# Patient Record
Sex: Female | Born: 1980 | Race: Asian | Hispanic: No | Marital: Married | Smoking: Never smoker
Health system: Southern US, Community
[De-identification: ages and names within clinical notes are randomized; demographics above are authoritative.]

## PROBLEM LIST (undated history)

## (undated) DIAGNOSIS — J45909 Unspecified asthma, uncomplicated: Secondary | ICD-10-CM

## (undated) DIAGNOSIS — F418 Other specified anxiety disorders: Secondary | ICD-10-CM

## (undated) HISTORY — DX: Unspecified asthma, uncomplicated: J45.909

## (undated) HISTORY — DX: Other specified anxiety disorders: F41.8

---

## 2011-04-29 ENCOUNTER — Inpatient Hospital Stay (HOSPITAL_COMMUNITY)
Admission: AD | Admit: 2011-04-29 | Discharge: 2011-04-29 | Disposition: A | Discharge status: Home / Self Care | Payer: 59 | Source: Ambulatory Visit | Attending: Obstetrics and Gynecology | Admitting: Obstetrics and Gynecology

## 2011-04-29 DIAGNOSIS — O479 False labor, unspecified: Secondary | ICD-10-CM | POA: Insufficient documentation

## 2011-05-16 ENCOUNTER — Inpatient Hospital Stay (HOSPITAL_COMMUNITY)
Admission: AD | Admit: 2011-05-16 | Discharge: 2011-05-19 | DRG: 775 | Disposition: A | Discharge status: Home / Self Care | Payer: 59 | Source: Ambulatory Visit | Attending: Obstetrics and Gynecology | Admitting: Obstetrics and Gynecology

## 2011-05-16 LAB — CBC
HCT: 38.3 % (ref 36.0–46.0)
Hemoglobin: 12.9 g/dL (ref 12.0–15.0)
MCH: 28.8 pg (ref 26.0–34.0)
MCHC: 33.7 g/dL (ref 30.0–36.0)
RDW: 14.3 % (ref 11.5–15.5)

## 2011-05-18 LAB — CBC
HCT: 31.9 % — ABNORMAL LOW (ref 36.0–46.0)
MCHC: 32.6 g/dL (ref 30.0–36.0)
MCV: 86.4 fL (ref 78.0–100.0)
RDW: 14.6 % (ref 11.5–15.5)

## 2011-05-24 ENCOUNTER — Inpatient Hospital Stay (HOSPITAL_COMMUNITY): Admission: AD | Admit: 2011-05-24 | Payer: Self-pay | Admitting: Obstetrics and Gynecology

## 2011-06-06 ENCOUNTER — Ambulatory Visit (HOSPITAL_COMMUNITY)
Admission: RE | Admit: 2011-06-06 | Discharge: 2011-06-06 | Disposition: A | Discharge status: Home / Self Care | Payer: 59 | Source: Ambulatory Visit | Attending: Obstetrics and Gynecology | Admitting: Obstetrics and Gynecology

## 2011-06-06 NOTE — Progress Notes (Signed)
Mom comes in for assist with latch, complaining of painful breastfeeding and latching too shallow.  Baby is 62 weeks old, (DOB [birth year]), birth weight 7 lbs. 0 oz. Baby has small diameter mouth and mother stateshe "purses" her mouth when latching which causes pinching pain.   Baby's weight today is 8 lbs. 0.2 oz. Mom uses proper support and positioning on her breast, using cross cradle hold.  Instructed to support baby's head with holding her head from ear to ear, this way she has better control.  Baby latches and slips to base of nipple.  Nipples are pink, but not visible trauma noted on them.  No c/o burning pain.  Teaching done to wait until baby opens wider, and then quickly latch deeply to breast.   Demonstrated chin tug to uncurl a tucked in lower lip.  Mom stated that latch felt more comfortable after lower lip uncurled. Baby fed well on left breast - 40 ml transferred in 10 mins.  Talked about fullness in breast, and to decrease her regular pumping some to help her body regulate milk supply to her baby's need.  Mom latched baby in football hold on right breast.  Repeated chin pulling to uncurl lower lip.  Baby transferred 94 ml from right breast!  Reassured Mom that baby is doing well, and to continue to monitor baby's latch, corrected while still on breast as needed.  To call us prn if pain worsens, or doesn't resolve in a week.

## 2012-03-22 ENCOUNTER — Other Ambulatory Visit (HOSPITAL_COMMUNITY)
Admission: RE | Admit: 2012-03-22 | Discharge: 2012-03-22 | Disposition: A | Discharge status: Home / Self Care | Payer: 59 | Source: Ambulatory Visit | Attending: Family Medicine | Admitting: Family Medicine

## 2012-03-22 DIAGNOSIS — Z1159 Encounter for screening for other viral diseases: Secondary | ICD-10-CM | POA: Insufficient documentation

## 2012-03-22 DIAGNOSIS — Z124 Encounter for screening for malignant neoplasm of cervix: Secondary | ICD-10-CM | POA: Insufficient documentation

## 2015-01-16 LAB — OB RESULTS CONSOLE RPR: RPR: NONREACTIVE

## 2015-01-16 LAB — OB RESULTS CONSOLE RUBELLA ANTIBODY, IGM: RUBELLA: IMMUNE

## 2015-01-16 LAB — OB RESULTS CONSOLE HIV ANTIBODY (ROUTINE TESTING): HIV: NONREACTIVE

## 2015-01-16 LAB — OB RESULTS CONSOLE HEPATITIS B SURFACE ANTIGEN: HEP B S AG: NEGATIVE

## 2015-01-19 LAB — OB RESULTS CONSOLE GC/CHLAMYDIA
Chlamydia: NEGATIVE
Gonorrhea: NEGATIVE

## 2015-07-24 LAB — OB RESULTS CONSOLE GBS: GBS: POSITIVE

## 2015-08-08 ENCOUNTER — Inpatient Hospital Stay (HOSPITAL_COMMUNITY)
Admission: AD | Admit: 2015-08-08 | Discharge: 2015-08-10 | DRG: 775 | Disposition: A | Discharge status: Home / Self Care | Payer: BLUE CROSS/BLUE SHIELD | Source: Ambulatory Visit | Attending: Obstetrics and Gynecology | Admitting: Obstetrics and Gynecology

## 2015-08-08 ENCOUNTER — Inpatient Hospital Stay (HOSPITAL_COMMUNITY): Payer: BLUE CROSS/BLUE SHIELD | Admitting: Anesthesiology

## 2015-08-08 ENCOUNTER — Encounter (HOSPITAL_COMMUNITY): Payer: Self-pay | Admitting: *Deleted

## 2015-08-08 DIAGNOSIS — Z3A00 Weeks of gestation of pregnancy not specified: Secondary | ICD-10-CM | POA: Diagnosis not present

## 2015-08-08 DIAGNOSIS — O99824 Streptococcus B carrier state complicating childbirth: Secondary | ICD-10-CM | POA: Diagnosis present

## 2015-08-08 LAB — CBC
HCT: 37.4 % (ref 36.0–46.0)
Hemoglobin: 12.7 g/dL (ref 12.0–15.0)
MCH: 28.5 pg (ref 26.0–34.0)
MCHC: 34 g/dL (ref 30.0–36.0)
MCV: 84 fL (ref 78.0–100.0)
PLATELETS: 167 10*3/uL (ref 150–400)
RBC: 4.45 MIL/uL (ref 3.87–5.11)
RDW: 14.8 % (ref 11.5–15.5)
WBC: 12.3 10*3/uL — ABNORMAL HIGH (ref 4.0–10.5)

## 2015-08-08 LAB — TYPE AND SCREEN
ABO/RH(D): B POS
Antibody Screen: NEGATIVE

## 2015-08-08 LAB — RPR: RPR: NONREACTIVE

## 2015-08-08 LAB — ABO/RH: ABO/RH(D): B POS

## 2015-08-08 MED ORDER — TETANUS-DIPHTH-ACELL PERTUSSIS 5-2.5-18.5 LF-MCG/0.5 IM SUSP: 0.5000 mL | Freq: Once | INTRAMUSCULAR | Status: DC

## 2015-08-08 MED ORDER — OXYCODONE-ACETAMINOPHEN 5-325 MG PO TABS: 1.0000 | ORAL_TABLET | ORAL | Status: DC | PRN

## 2015-08-08 MED ORDER — PENICILLIN G POTASSIUM 5000000 UNITS IJ SOLR
5.0000 10*6.[IU] | Freq: Once | INTRAVENOUS | Status: AC
  Administered 2015-08-08: 5 10*6.[IU] via INTRAVENOUS
  Filled 2015-08-08: qty 5

## 2015-08-08 MED ORDER — LACTATED RINGERS IV SOLN
INTRAVENOUS | Status: DC
  Administered 2015-08-08 (×3): via INTRAVENOUS

## 2015-08-08 MED ORDER — MEDROXYPROGESTERONE ACETATE 150 MG/ML IM SUSP: 150.0000 mg | INTRAMUSCULAR | Status: DC | PRN

## 2015-08-08 MED ORDER — ACETAMINOPHEN 325 MG PO TABS: 650.0000 mg | ORAL_TABLET | ORAL | Status: DC | PRN

## 2015-08-08 MED ORDER — LIDOCAINE HCL (PF) 1 % IJ SOLN
30.0000 mL | INTRAMUSCULAR | Status: DC | PRN
  Filled 2015-08-08: qty 30

## 2015-08-08 MED ORDER — LACTATED RINGERS IV SOLN
500.0000 mL | INTRAVENOUS | Status: DC | PRN
  Administered 2015-08-08: 300 mL via INTRAVENOUS
  Administered 2015-08-08: 500 mL via INTRAVENOUS

## 2015-08-08 MED ORDER — OXYCODONE-ACETAMINOPHEN 5-325 MG PO TABS
1.0000 | ORAL_TABLET | ORAL | Status: DC | PRN
Start: 2015-08-08 — End: 2015-08-10

## 2015-08-08 MED ORDER — ZOLPIDEM TARTRATE 5 MG PO TABS: 5.0000 mg | ORAL_TABLET | Freq: Every evening | ORAL | Status: DC | PRN

## 2015-08-08 MED ORDER — SIMETHICONE 80 MG PO CHEW
80.0000 mg | CHEWABLE_TABLET | ORAL | Status: DC | PRN
Start: 2015-08-08 — End: 2015-08-10

## 2015-08-08 MED ORDER — ONDANSETRON HCL 4 MG PO TABS: 4.0000 mg | ORAL_TABLET | ORAL | Status: DC | PRN

## 2015-08-08 MED ORDER — ONDANSETRON HCL 4 MG/2ML IJ SOLN: 4.0000 mg | Freq: Four times a day (QID) | INTRAMUSCULAR | Status: DC | PRN

## 2015-08-08 MED ORDER — BENZOCAINE-MENTHOL 20-0.5 % EX AERO
1.0000 "application " | INHALATION_SPRAY | CUTANEOUS | Status: DC | PRN
  Administered 2015-08-08: 1 via TOPICAL
  Filled 2015-08-08: qty 56

## 2015-08-08 MED ORDER — WITCH HAZEL-GLYCERIN EX PADS
1.0000 "application " | MEDICATED_PAD | CUTANEOUS | Status: DC | PRN
  Administered 2015-08-08: 1 via TOPICAL

## 2015-08-08 MED ORDER — DIPHENHYDRAMINE HCL 25 MG PO CAPS: 25.0000 mg | ORAL_CAPSULE | Freq: Four times a day (QID) | ORAL | Status: DC | PRN

## 2015-08-08 MED ORDER — FENTANYL 2.5 MCG/ML BUPIVACAINE 1/10 % EPIDURAL INFUSION (WH - ANES)
14.0000 mL/h | INTRAMUSCULAR | Status: DC | PRN
  Administered 2015-08-08 (×2): 14 mL/h via EPIDURAL
  Filled 2015-08-08: qty 125

## 2015-08-08 MED ORDER — DIBUCAINE 1 % RE OINT: 1.0000 "application " | TOPICAL_OINTMENT | RECTAL | Status: DC | PRN

## 2015-08-08 MED ORDER — SENNOSIDES-DOCUSATE SODIUM 8.6-50 MG PO TABS
2.0000 | ORAL_TABLET | ORAL | Status: DC
  Administered 2015-08-08 – 2015-08-10 (×2): 2 via ORAL
  Filled 2015-08-08 (×2): qty 2

## 2015-08-08 MED ORDER — PENICILLIN G POTASSIUM 5000000 UNITS IJ SOLR
2.5000 10*6.[IU] | INTRAMUSCULAR | Status: DC
  Administered 2015-08-08 (×3): 2.5 10*6.[IU] via INTRAVENOUS
  Filled 2015-08-08 (×6): qty 2.5

## 2015-08-08 MED ORDER — PHENYLEPHRINE 40 MCG/ML (10ML) SYRINGE FOR IV PUSH (FOR BLOOD PRESSURE SUPPORT)
80.0000 ug | PREFILLED_SYRINGE | INTRAVENOUS | Status: DC | PRN
  Filled 2015-08-08: qty 2
  Filled 2015-08-08: qty 20

## 2015-08-08 MED ORDER — PRENATAL MULTIVITAMIN CH
1.0000 | ORAL_TABLET | Freq: Every day | ORAL | Status: DC
  Administered 2015-08-09: 1 via ORAL
  Filled 2015-08-08: qty 1

## 2015-08-08 MED ORDER — CITRIC ACID-SODIUM CITRATE 334-500 MG/5ML PO SOLN: 30.0000 mL | ORAL | Status: DC | PRN

## 2015-08-08 MED ORDER — OXYCODONE-ACETAMINOPHEN 5-325 MG PO TABS: 2.0000 | ORAL_TABLET | ORAL | Status: DC | PRN

## 2015-08-08 MED ORDER — EPHEDRINE 5 MG/ML INJ
10.0000 mg | INTRAVENOUS | Status: DC | PRN
  Filled 2015-08-08: qty 2

## 2015-08-08 MED ORDER — DIPHENHYDRAMINE HCL 50 MG/ML IJ SOLN: 12.5000 mg | INTRAMUSCULAR | Status: DC | PRN

## 2015-08-08 MED ORDER — IBUPROFEN 600 MG PO TABS
600.0000 mg | ORAL_TABLET | Freq: Four times a day (QID) | ORAL | Status: DC
  Administered 2015-08-08 – 2015-08-10 (×6): 600 mg via ORAL
  Filled 2015-08-08 (×6): qty 1

## 2015-08-08 MED ORDER — ONDANSETRON HCL 4 MG/2ML IJ SOLN: 4.0000 mg | INTRAMUSCULAR | Status: DC | PRN

## 2015-08-08 MED ORDER — LIDOCAINE HCL (PF) 1 % IJ SOLN
INTRAMUSCULAR | Status: DC | PRN
  Administered 2015-08-08 (×2): 8 mL via EPIDURAL

## 2015-08-08 MED ORDER — MEASLES, MUMPS & RUBELLA VAC ~~LOC~~ INJ: 0.5000 mL | INJECTION | Freq: Once | SUBCUTANEOUS | Status: DC

## 2015-08-08 MED ORDER — OXYTOCIN 40 UNITS IN LACTATED RINGERS INFUSION - SIMPLE MED
62.5000 mL/h | INTRAVENOUS | Status: DC
  Filled 2015-08-08: qty 1000

## 2015-08-08 MED ORDER — OXYTOCIN BOLUS FROM INFUSION
500.0000 mL | INTRAVENOUS | Status: DC
  Administered 2015-08-08: 500 mL via INTRAVENOUS

## 2015-08-08 MED ORDER — LANOLIN HYDROUS EX OINT: TOPICAL_OINTMENT | CUTANEOUS | Status: DC | PRN

## 2015-08-08 NOTE — Progress Notes (Signed)
Pt informed that Dr Renaldo Fiddler recommendation at this time is to start pitocin since arom and no cervical change.  Pt refuses pitocin at this time

## 2015-08-08 NOTE — Progress Notes (Signed)
Dr Arelia Sneddon notified of pt's arrival and complaints, ROM, orders received to admit pt

## 2015-08-08 NOTE — MAU Note (Signed)
Pt reports ROM at 0045, some contractions. G3P1

## 2015-08-08 NOTE — Anesthesia Preprocedure Evaluation (Addendum)
Anesthesia Evaluation  Patient identified by MRN, date of birth, ID band Patient awake    Reviewed: Allergy & Precautions, H&P , NPO status , Patient's Chart, lab work & pertinent test results  Airway Mallampati: II  TM Distance: >3 FB Neck ROM: full    Dental no notable dental hx.    Pulmonary neg pulmonary ROS,    Pulmonary exam normal       Cardiovascular negative cardio ROS Normal cardiovascular exam    Neuro/Psych negative neurological ROS  negative psych ROS   GI/Hepatic negative GI ROS, Neg liver ROS,   Endo/Other  negative endocrine ROS  Renal/GU negative Renal ROS     Musculoskeletal   Abdominal Normal abdominal exam  (+)   Peds  Hematology negative hematology ROS (+)   Anesthesia Other Findings   Reproductive/Obstetrics (+) Pregnancy                             Anesthesia Physical Anesthesia Plan  ASA: II  Anesthesia Plan: Epidural   Post-op Pain Management:    Induction:   Airway Management Planned:   Additional Equipment:   Intra-op Plan:   Post-operative Plan:   Informed Consent: I have reviewed the patients History and Physical, chart, labs and discussed the procedure including the risks, benefits and alternatives for the proposed anesthesia with the patient or authorized representative who has indicated his/her understanding and acceptance.     Plan Discussed with:   Anesthesia Plan Comments:         Anesthesia Quick Evaluation  

## 2015-08-08 NOTE — H&P (Signed)
Brandi Snyder is a 34 y.o. female presenting for SROM.  Notes irregular ctx.  Pregnancy uncomplicated.  History OB History    Gravida Para Term Preterm AB TAB SAB Ectopic Multiple Living   History reviewed. No pertinent past medical history. History reviewed. No pertinent past surgical history. Family History: family history is not on file. Social History:  reports that she has never smoked. She has never used smokeless tobacco. She reports that she does not drink alcohol or use illicit drugs.   Prenatal Transfer Tool  Maternal Diabetes: No Genetic Screening: Normal Maternal Ultrasounds/Referrals: Normal Fetal Ultrasounds or other Referrals:  None Maternal Substance Abuse:  No Significant Maternal Medications:  None Significant Maternal Lab Results:  None Other Comments:  None  ROS  Dilation: 5 Effacement (%): 90 Station: -2 Exam by:: Tomie China, RN Blood pressure 123/76, pulse 97, temperature 98.3 F (36.8 C), temperature source Oral, resp. rate 18, height  (1.575 m), weight 190 lb (86.183 kg), SpO2 98 %. Exam Physical Exam  gen - NAD Abd - gravid, NT Ext - NT, no edema Cvx 5/90/-2  Prenatal labs: ABO, Rh: --/--/B POS, B POS (09/21 0340) Antibody: NEG (09/21 0340) Rubella: Immune (03/01 0000) RPR: Nonreactive (03/01 0000)  HBsAg: Negative (03/01 0000)  HIV: Non-reactive (03/01 0000)  GBS: Positive (09/06 0000)   Assessment/Plan: Admit Exp mngt - augment w/ pitocin Epidural prn   ADKINS,GRETCHEN 08/08/2015, 7:33 AM

## 2015-08-08 NOTE — Anesthesia Procedure Notes (Signed)
Epidural Patient location during procedure: OB Start time: 08/08/2015 12:40 PM End time: 08/08/2015 12:48 PM  Staffing Anesthesiologist: Leilani Able Performed by: anesthesiologist   Preanesthetic Checklist Completed: patient identified, surgical consent, pre-op evaluation, timeout performed, IV checked, risks and benefits discussed and monitors and equipment checked  Epidural Patient position: sitting Prep: site prepped and draped and DuraPrep Patient monitoring: continuous pulse ox and blood pressure Approach: midline Location: L3-L4 Injection technique: LOR air  Needle:  Needle type: Tuohy  Needle gauge: 17 G Needle length: 9 cm and 9 Needle insertion depth: 5 cm cm Catheter type: closed end flexible Catheter size: 19 Gauge Catheter at skin depth: 10 cm Test dose: negative and Other  Assessment Sensory level: T9 Events: blood not aspirated, injection not painful, no injection resistance, negative IV test and no paresthesia  Additional Notes Reason for block:procedure for pain

## 2015-08-08 NOTE — Progress Notes (Signed)
svd of vigorous female infant, apgars 9,9 Placenta delivered spontaneous, intact w/ 3VC 2nd degree laceration repaired w/ 3-0 vicryl rapide Fundus firm.  EBL 300cc Mom and baby stable couplet care

## 2015-08-09 LAB — CBC
HCT: 33.6 % — ABNORMAL LOW (ref 36.0–46.0)
HEMOGLOBIN: 10.9 g/dL — AB (ref 12.0–15.0)
MCH: 27.4 pg (ref 26.0–34.0)
MCHC: 32.4 g/dL (ref 30.0–36.0)
MCV: 84.4 fL (ref 78.0–100.0)
PLATELETS: 142 10*3/uL — AB (ref 150–400)
RBC: 3.98 MIL/uL (ref 3.87–5.11)
RDW: 14.9 % (ref 11.5–15.5)
WBC: 13.2 10*3/uL — AB (ref 4.0–10.5)

## 2015-08-09 NOTE — Anesthesia Postprocedure Evaluation (Signed)
Anesthesia Post Note  Patient: Brandi Snyder  Procedure(s) Performed: * No procedures listed *  Anesthesia type: Epidural  Patient location: Mother/Baby  Post pain: Pain level controlled  Post assessment: Post-op Vital signs reviewed  Last Vitals:  Filed Vitals:   08/09/15 0640  BP: 109/49  Pulse: 82  Temp: 36.5 C  Resp: 17    Post vital signs: Reviewed  Level of consciousness:alert  Complications: No apparent anesthesia complications

## 2015-08-09 NOTE — Lactation Note (Signed)
This note was copied from the chart of Brandi Snyder. Lactation Consultation Note; Experienced BF mom reports baby has been nursing pretty well but her nipples are getting tender- nipples pink but look intact. Assisted mom with latch in cross cradle hold- encouraged to wait for wide open mouth and to keep the baby close to the breast throughout the feeding. Mom reports that feels much better. Still nursing when I left room. Mom requested manual pump and comfort gels. - given with instructions for use and cleaning. Discussed cluster feeding and encouraged to feed whenever she sees feeding cues, No further questions at present. To call prn  Patient Name: Brandi Wells Gerdeman [id number] Date: 08/09/2015 Reason for consult: Initial assessment   Maternal Data Formula Feeding for Exclusion: No Has patient been taught Hand Expression?: Yes Does the patient have breastfeeding experience prior to this delivery?: Yes  Feeding Feeding Type: Breast Fed Length of feed: 25 min  LATCH Score/Interventions Latch: Grasps breast easily, tongue down, lips flanged, rhythmical sucking. Intervention(s): Breast massage  Audible Swallowing: A few with stimulation Intervention(s): Skin to skin Intervention(s): Hand expression  Type of Nipple: Everted at rest and after stimulation  Comfort (Breast/Nipple): Filling, red/small blisters or bruises, mild/mod discomfort  Problem noted: Mild/Moderate discomfort Interventions (Mild/moderate discomfort): Comfort gels;Hand expression  Hold (Positioning): Assistance needed to correctly position infant at breast and maintain latch. Intervention(s): Breastfeeding basics reviewed  LATCH Score: 7  Lactation Tools Discussed/Used WIC Program: No Pump Review: Setup, frequency, and cleaning Initiated by:: dw Date initiated:: 08/09/15   Consult Status Consult Status: Follow-up Date: 08/10/15 Follow-up type: In-patient    Pamelia Hoit 08/09/2015, 1:38  PM

## 2015-08-09 NOTE — Progress Notes (Signed)
S:  Patient is doing well.  O:  BP 109/49 mmHg  Pulse 82  Temp(Src) 97.7 F (36.5 C) (Oral)  Resp 17  Ht  (1.575 m)  Wt 86.183 kg (190 lb)  BMI 34.74 kg/m2  SpO2 98%  Breastfeeding? Unknown Abdomen is soft and non tender  Results for orders placed or performed during the hospital encounter of 08/08/15 (from the past 24 hour(s))  CBC     Status: Abnormal   Collection Time: 08/09/15  6:36 AM  Result Value Ref Range   WBC 13.2 (H) 4.0 - 10.5 K/uL   RBC 3.98 3.87 - 5.11 MIL/uL   Hemoglobin 10.9 (L) 12.0 - 15.0 g/dL   HCT 16.1 (L) 09.6 - 04.5 %   MCV 84.4 78.0 - 100.0 fL   MCH 27.4 26.0 - 34.0 pg   MCHC 32.4 30.0 - 36.0 g/dL   RDW 40.9 81.1 - 91.4 %   Platelets 142 (L) 150 - 400 K/uL   IMPRESSION: PPD #1 Doing well Routine care Patient desires circ to be performed in peds office

## 2015-08-10 MED ORDER — IBUPROFEN 600 MG PO TABS: 600.0000 mg | ORAL_TABLET | Freq: Four times a day (QID) | ORAL | Status: AC | PRN

## 2015-08-10 NOTE — Progress Notes (Signed)
Post Partum Day 2 Subjective: no complaints, up ad lib, voiding, tolerating PO and + flatus  Objective: Blood pressure 115/41, pulse 66, temperature 98.4 F (36.9 C), temperature source Oral, resp. rate 18, height  (1.575 m), weight 190 lb (86.183 kg), SpO2 98 %, unknown if currently breastfeeding.  Physical Exam:  General: alert, cooperative and no distress Lochia: appropriate Uterine Fundus: firm Incision: healing well DVT Evaluation: No evidence of DVT seen on physical exam.   Recent Labs  08/08/15 0340 08/09/15 0636  HGB 12.7 10.9*  HCT 37.4 33.6*    Assessment/Plan: Discharge home   LOS: 2 days   TOMBLIN II,JAMES E 08/10/2015, 8:39 AM

## 2015-08-10 NOTE — Discharge Summary (Signed)
Obstetric Discharge Summary Reason for Admission: onset of labor Prenatal Procedures: none Intrapartum Procedures: spontaneous vaginal delivery Postpartum Procedures: none Complications-Operative and Postpartum: none HEMOGLOBIN  Date Value Ref Range Status  08/09/2015 10.9* 12.0 - 15.0 g/dL Final   HCT  Date Value Ref Range Status  08/09/2015 33.6* 36.0 - 46.0 % Final    Physical Exam:  General: alert, cooperative and no distress Lochia: appropriate Uterine Fundus: firm Incision: healing well DVT Evaluation: No evidence of DVT seen on physical exam.  Discharge Diagnoses: Term Pregnancy-delivered  Discharge Information: Date: 08/10/2015 Activity: pelvic rest Diet: routine Medications: PNV and Ibuprofen Condition: stable Instructions: refer to practice specific booklet Discharge to: home   Newborn Data: Live born female  Birth Weight: 7 lb 9.7 oz (3450 g) APGAR: 9, 9  Home with mother.  TOMBLIN II,JAMES E 08/10/2015, 2:07 PM

## 2015-08-22 ENCOUNTER — Ambulatory Visit (HOSPITAL_COMMUNITY)
Admission: RE | Admit: 2015-08-22 | Discharge: 2015-08-22 | Disposition: A | Discharge status: Home / Self Care | Payer: BLUE CROSS/BLUE SHIELD | Source: Ambulatory Visit | Attending: Obstetrics and Gynecology | Admitting: Obstetrics and Gynecology

## 2015-08-22 NOTE — Lactation Note (Signed)
Lactation Consult  Mother's reason for visit: shallow latch per mom  Visit Type:  Feeding assessment  Appointment Notes: Difficult latch  Consult:  Initial Lactation Consultant:  Kathrin Greathouse  ________________________________________________________________________ Joan Flores Name: Jenita Seashore Lui Date of Birth: 08/08/2016 Pediatrician: Dr. Suzanna Obey  Gender: female Gestational Age: [redacted]w[redacted]d (At Birth) Birth Weight: 7 lb 9.7 oz (3450 g) Weight at Discharge: Weight: 7 lb 2.3 oz (3240 g)Date of Discharge: 08/10/2015 Filed Weights   08/08/15 1721 08/08/15 2337 08/09/15 2338  Weight: 7 lb 9.7 oz (3450 g) 7 lb 8.8 oz (3425 g) 7 lb 2.3 oz (3240 g)   Last weight taken from location outside of [facility]:7-9 oz 10 /3 - per mom Dr. Isidore Moos   Today's weight - 3474 g , 7-10.6 oz    ________________________________________________________________________  Mother's Name: Hassell Done Type of delivery:  Vaginal Delivery  Breastfeeding Experience:  1st baby breast fed 14 months , this is moms 2nd baby Maternal Medical Conditions:  No risk  Maternal Medications: PNV , Stool Softener - Colace   ________________________________________________________________________  Breastfeeding History (Post Discharge)  Frequency of breastfeeding:  Per mom every 2-3 hours  Duration of feeding:  20 -40 mins   Supplementing: none per mom   Pumping - Hand pump , and DEBP Medela 1-2 times / day , 2.5 oz   Infant Intake and Output Assessment  Voids:  5  in 24 hrs.  Color:  Clear yellow Stools:  3-5  in 24 hrs.  Color:  Yellow  ________________________________________________________________________  Maternal Breast Assessment  Breast:  Full Nipple:  Erect Pain level:  2 Pain interventions:  Expressed breast milk  _______________________________________________________________________ Feeding Assessment/Evaluation- color pink, baby presents being  fussy and hungry , settled down once he was fed and satisfied   Initial feeding assessment:  Infant's oral assessment:  Variance - short labial frenulum slightly above gum line , upper lip with exam at latch  Some mobility of tongue , LC suspects  Short posterior frenulum , high palate, unable to raise tongue above the corners of mouth  And noted to cup is tongue   Positioning:  Football Left breast  LATCH documentation:  Latch:  2 = Grasps breast easily, tongue down, lips flanged, rhythmical sucking.  Audible swallowing:  2 = Spontaneous and intermittent  Type of nipple:  2 = Everted at rest and after stimulation  Comfort (Breast/Nipple):  1 = Filling, red/small blisters or bruises, mild/mod discomfort  Hold (Positioning):  1 = Assistance needed to correctly position infant at breast and maintain latch  LATCH score:  8   Attached assessment:  Deep  Lips flanged:  Yes.    Lips untucked:  No.  Suck assessment:  Nutritive  Tools:  None  Instructed on use and cleaning of tool:  No.  Pre-feed weight:  3474 g , 7-10.6 oz  Post-feed weight:  3522g , 7-12.2 oz  Amount transferred:  48 ml  Amount supplemented:  None   Additional Feeding Assessment -   Infant's oral assessment:  Variance - see above note   Positioning:  Cross cradle Right breast  LATCH documentation:  Latch:  2 = Grasps breast easily, tongue down, lips flanged, rhythmical sucking.  Audible swallowing:  2 = Spontaneous and intermittent  Type of nipple:  2 = Everted at rest and after stimulation  Comfort (Breast/Nipple):  1 = Filling, red/small blisters or bruises, mild/mod discomfort  Hold (Positioning):  1 = Assistance needed to correctly position infant at breast  and maintain latch  LATCH score:  8   Attached assessment:  Deep  Lips flanged:  No.  Lips untucked:  Yes.    Suck assessment:  Nutritive  Tools:  Comfort gels Instructed on use and cleaning of tool:  Yes.    Pre-feed weight:  3522  g ,  7-12.2 oz  Post-feed weight:  3534 g , 7-12.7 oz  Amount transferred:  12 ml  Amount supplemented:  None   Additional Feeding; Pre- weight - 3534 g , 7-12.7 oz  Post - weight- 3546g , 7-13.1  Amount transferred: 12 ml  Amount supplemented: none   Amount collected from leakage = 16 ml    Total amount pumped post feed: none needed   Total amount transferred:  72 ml  Plus 16 ml leakage off the opposite breast when mom was feeding  Total supplement given:  None needed   Lactation Impression:  Mom's initial reason for LC visit was Difficult latch  With assessment she mentioned sore nipples  Nipples both appeared healthy , with latch per mom feeling intermittent pinching  Breast compressions helped and when  Moms breast weren't has full. LC suspects a frenulum issue - see above note for details  Baby did well with the volume of milk transferred off the breast for age - 54 weeks old - 72 ml  Baby is back up to Birth weight as of 10/3 at the Dr. Isidore Moos and 7-10.6 oz today. Mom's milk supply is at a good level for age of baby  See [facility] Plan for details below    Lactation Plan: Praised mom for her efforts breast feeding  Per mom F/U with Dr. Suzanna Obey 10/21 1 month apt. Lc recommended to mom to consider attending the [facility] Monday Evening Support Group at 7 pm or Tuesday 11am. Sore Nipple Prevention and tx  EBM to nipples liberally, cold comfort gels for 6 days after feeding  If to full @ start of feeding - hand express or use hand pump to release fullness 10 ml  If Vincenza Hews only feeds 1st breast , release 2nd breast down if needed , Keep it simple - hand express - or any pump  Depth at the breast with latch importance for prevention of soreness If intermittent discomfort pinching continues with latch even with above recommendations - see Frenulum web sites Transitioning to Bottle for flexibility and returning back to work  At 4-6 weeks PP, after am feeding post pump , mid day ,  afternoon, 10 -15 mins, save milk  1 bottle a day at that age 10-4 oz.  Call as needed with Breast feeding Questions or concerns

## 2016-02-26 DIAGNOSIS — M9901 Segmental and somatic dysfunction of cervical region: Secondary | ICD-10-CM | POA: Diagnosis not present

## 2016-02-26 DIAGNOSIS — M542 Cervicalgia: Secondary | ICD-10-CM | POA: Diagnosis not present

## 2016-02-27 DIAGNOSIS — M9901 Segmental and somatic dysfunction of cervical region: Secondary | ICD-10-CM | POA: Diagnosis not present

## 2016-02-27 DIAGNOSIS — M542 Cervicalgia: Secondary | ICD-10-CM | POA: Diagnosis not present

## 2016-02-28 DIAGNOSIS — M542 Cervicalgia: Secondary | ICD-10-CM | POA: Diagnosis not present

## 2016-02-28 DIAGNOSIS — M9901 Segmental and somatic dysfunction of cervical region: Secondary | ICD-10-CM | POA: Diagnosis not present

## 2016-03-03 DIAGNOSIS — M9901 Segmental and somatic dysfunction of cervical region: Secondary | ICD-10-CM | POA: Diagnosis not present

## 2016-03-03 DIAGNOSIS — M542 Cervicalgia: Secondary | ICD-10-CM | POA: Diagnosis not present

## 2016-03-05 DIAGNOSIS — Z30430 Encounter for insertion of intrauterine contraceptive device: Secondary | ICD-10-CM | POA: Diagnosis not present

## 2016-03-05 DIAGNOSIS — M9901 Segmental and somatic dysfunction of cervical region: Secondary | ICD-10-CM | POA: Diagnosis not present

## 2016-03-05 DIAGNOSIS — Z3200 Encounter for pregnancy test, result unknown: Secondary | ICD-10-CM | POA: Diagnosis not present

## 2016-03-05 DIAGNOSIS — M542 Cervicalgia: Secondary | ICD-10-CM | POA: Diagnosis not present

## 2016-03-10 DIAGNOSIS — M9901 Segmental and somatic dysfunction of cervical region: Secondary | ICD-10-CM | POA: Diagnosis not present

## 2016-03-10 DIAGNOSIS — M542 Cervicalgia: Secondary | ICD-10-CM | POA: Diagnosis not present

## 2016-03-13 DIAGNOSIS — M542 Cervicalgia: Secondary | ICD-10-CM | POA: Diagnosis not present

## 2016-03-13 DIAGNOSIS — M9901 Segmental and somatic dysfunction of cervical region: Secondary | ICD-10-CM | POA: Diagnosis not present

## 2016-03-17 DIAGNOSIS — M542 Cervicalgia: Secondary | ICD-10-CM | POA: Diagnosis not present

## 2016-03-17 DIAGNOSIS — M9901 Segmental and somatic dysfunction of cervical region: Secondary | ICD-10-CM | POA: Diagnosis not present

## 2016-03-24 DIAGNOSIS — M542 Cervicalgia: Secondary | ICD-10-CM | POA: Diagnosis not present

## 2016-03-24 DIAGNOSIS — M9901 Segmental and somatic dysfunction of cervical region: Secondary | ICD-10-CM | POA: Diagnosis not present

## 2016-03-31 DIAGNOSIS — M9901 Segmental and somatic dysfunction of cervical region: Secondary | ICD-10-CM | POA: Diagnosis not present

## 2016-03-31 DIAGNOSIS — M542 Cervicalgia: Secondary | ICD-10-CM | POA: Diagnosis not present

## 2016-04-07 DIAGNOSIS — M9901 Segmental and somatic dysfunction of cervical region: Secondary | ICD-10-CM | POA: Diagnosis not present

## 2016-04-07 DIAGNOSIS — M542 Cervicalgia: Secondary | ICD-10-CM | POA: Diagnosis not present

## 2016-04-23 DIAGNOSIS — M9901 Segmental and somatic dysfunction of cervical region: Secondary | ICD-10-CM | POA: Diagnosis not present

## 2016-04-23 DIAGNOSIS — M542 Cervicalgia: Secondary | ICD-10-CM | POA: Diagnosis not present

## 2016-05-14 DIAGNOSIS — M9901 Segmental and somatic dysfunction of cervical region: Secondary | ICD-10-CM | POA: Diagnosis not present

## 2016-05-14 DIAGNOSIS — M542 Cervicalgia: Secondary | ICD-10-CM | POA: Diagnosis not present

## 2016-05-26 DIAGNOSIS — M9901 Segmental and somatic dysfunction of cervical region: Secondary | ICD-10-CM | POA: Diagnosis not present

## 2016-05-26 DIAGNOSIS — M542 Cervicalgia: Secondary | ICD-10-CM | POA: Diagnosis not present

## 2016-09-03 DIAGNOSIS — Z13220 Encounter for screening for lipoid disorders: Secondary | ICD-10-CM | POA: Diagnosis not present

## 2016-09-03 DIAGNOSIS — Z131 Encounter for screening for diabetes mellitus: Secondary | ICD-10-CM | POA: Diagnosis not present

## 2016-09-03 DIAGNOSIS — Z0000 Encounter for general adult medical examination without abnormal findings: Secondary | ICD-10-CM | POA: Diagnosis not present

## 2016-09-03 DIAGNOSIS — Z23 Encounter for immunization: Secondary | ICD-10-CM | POA: Diagnosis not present

## 2016-11-05 DIAGNOSIS — Z01419 Encounter for gynecological examination (general) (routine) without abnormal findings: Secondary | ICD-10-CM | POA: Diagnosis not present

## 2016-11-05 DIAGNOSIS — Z6827 Body mass index (BMI) 27.0-27.9, adult: Secondary | ICD-10-CM | POA: Diagnosis not present

## 2016-12-15 DIAGNOSIS — A084 Viral intestinal infection, unspecified: Secondary | ICD-10-CM | POA: Diagnosis not present

## 2017-08-13 DIAGNOSIS — L02421 Furuncle of right axilla: Secondary | ICD-10-CM | POA: Diagnosis not present

## 2017-08-13 DIAGNOSIS — B9562 Methicillin resistant Staphylococcus aureus infection as the cause of diseases classified elsewhere: Secondary | ICD-10-CM | POA: Diagnosis not present

## 2017-09-16 DIAGNOSIS — Z136 Encounter for screening for cardiovascular disorders: Secondary | ICD-10-CM | POA: Diagnosis not present

## 2017-09-16 DIAGNOSIS — N898 Other specified noninflammatory disorders of vagina: Secondary | ICD-10-CM | POA: Diagnosis not present

## 2017-09-16 DIAGNOSIS — Z23 Encounter for immunization: Secondary | ICD-10-CM | POA: Diagnosis not present

## 2017-09-16 DIAGNOSIS — Z0000 Encounter for general adult medical examination without abnormal findings: Secondary | ICD-10-CM | POA: Diagnosis not present

## 2017-09-16 DIAGNOSIS — Z131 Encounter for screening for diabetes mellitus: Secondary | ICD-10-CM | POA: Diagnosis not present

## 2017-12-02 DIAGNOSIS — Z6827 Body mass index (BMI) 27.0-27.9, adult: Secondary | ICD-10-CM | POA: Diagnosis not present

## 2017-12-02 DIAGNOSIS — Z01419 Encounter for gynecological examination (general) (routine) without abnormal findings: Secondary | ICD-10-CM | POA: Diagnosis not present

## 2018-09-14 DIAGNOSIS — M9907 Segmental and somatic dysfunction of upper extremity: Secondary | ICD-10-CM | POA: Diagnosis not present

## 2018-09-14 DIAGNOSIS — M9902 Segmental and somatic dysfunction of thoracic region: Secondary | ICD-10-CM | POA: Diagnosis not present

## 2018-09-14 DIAGNOSIS — M9901 Segmental and somatic dysfunction of cervical region: Secondary | ICD-10-CM | POA: Diagnosis not present

## 2018-09-14 DIAGNOSIS — M7541 Impingement syndrome of right shoulder: Secondary | ICD-10-CM | POA: Diagnosis not present

## 2018-09-21 DIAGNOSIS — M9902 Segmental and somatic dysfunction of thoracic region: Secondary | ICD-10-CM | POA: Diagnosis not present

## 2018-09-21 DIAGNOSIS — M9901 Segmental and somatic dysfunction of cervical region: Secondary | ICD-10-CM | POA: Diagnosis not present

## 2018-09-21 DIAGNOSIS — M9907 Segmental and somatic dysfunction of upper extremity: Secondary | ICD-10-CM | POA: Diagnosis not present

## 2018-09-21 DIAGNOSIS — M7541 Impingement syndrome of right shoulder: Secondary | ICD-10-CM | POA: Diagnosis not present

## 2018-09-30 DIAGNOSIS — M7541 Impingement syndrome of right shoulder: Secondary | ICD-10-CM | POA: Diagnosis not present

## 2018-09-30 DIAGNOSIS — M9902 Segmental and somatic dysfunction of thoracic region: Secondary | ICD-10-CM | POA: Diagnosis not present

## 2018-09-30 DIAGNOSIS — M9907 Segmental and somatic dysfunction of upper extremity: Secondary | ICD-10-CM | POA: Diagnosis not present

## 2018-09-30 DIAGNOSIS — M9901 Segmental and somatic dysfunction of cervical region: Secondary | ICD-10-CM | POA: Diagnosis not present

## 2018-10-06 DIAGNOSIS — Z0000 Encounter for general adult medical examination without abnormal findings: Secondary | ICD-10-CM | POA: Diagnosis not present

## 2018-10-06 DIAGNOSIS — Z131 Encounter for screening for diabetes mellitus: Secondary | ICD-10-CM | POA: Diagnosis not present

## 2018-10-06 DIAGNOSIS — Z13220 Encounter for screening for lipoid disorders: Secondary | ICD-10-CM | POA: Diagnosis not present

## 2018-11-02 DIAGNOSIS — M9901 Segmental and somatic dysfunction of cervical region: Secondary | ICD-10-CM | POA: Diagnosis not present

## 2018-11-02 DIAGNOSIS — M7541 Impingement syndrome of right shoulder: Secondary | ICD-10-CM | POA: Diagnosis not present

## 2018-11-02 DIAGNOSIS — M9907 Segmental and somatic dysfunction of upper extremity: Secondary | ICD-10-CM | POA: Diagnosis not present

## 2018-11-02 DIAGNOSIS — M9902 Segmental and somatic dysfunction of thoracic region: Secondary | ICD-10-CM | POA: Diagnosis not present

## 2018-11-04 DIAGNOSIS — M9901 Segmental and somatic dysfunction of cervical region: Secondary | ICD-10-CM | POA: Diagnosis not present

## 2018-11-04 DIAGNOSIS — M9907 Segmental and somatic dysfunction of upper extremity: Secondary | ICD-10-CM | POA: Diagnosis not present

## 2018-11-04 DIAGNOSIS — M9902 Segmental and somatic dysfunction of thoracic region: Secondary | ICD-10-CM | POA: Diagnosis not present

## 2018-11-04 DIAGNOSIS — M7541 Impingement syndrome of right shoulder: Secondary | ICD-10-CM | POA: Diagnosis not present

## 2018-11-09 DIAGNOSIS — M9901 Segmental and somatic dysfunction of cervical region: Secondary | ICD-10-CM | POA: Diagnosis not present

## 2018-11-09 DIAGNOSIS — M9902 Segmental and somatic dysfunction of thoracic region: Secondary | ICD-10-CM | POA: Diagnosis not present

## 2018-11-09 DIAGNOSIS — M9907 Segmental and somatic dysfunction of upper extremity: Secondary | ICD-10-CM | POA: Diagnosis not present

## 2018-11-09 DIAGNOSIS — M7541 Impingement syndrome of right shoulder: Secondary | ICD-10-CM | POA: Diagnosis not present

## 2018-11-15 DIAGNOSIS — M9902 Segmental and somatic dysfunction of thoracic region: Secondary | ICD-10-CM | POA: Diagnosis not present

## 2018-11-15 DIAGNOSIS — M9907 Segmental and somatic dysfunction of upper extremity: Secondary | ICD-10-CM | POA: Diagnosis not present

## 2018-11-15 DIAGNOSIS — M7541 Impingement syndrome of right shoulder: Secondary | ICD-10-CM | POA: Diagnosis not present

## 2018-11-15 DIAGNOSIS — M9901 Segmental and somatic dysfunction of cervical region: Secondary | ICD-10-CM | POA: Diagnosis not present

## 2018-12-15 DIAGNOSIS — Z6827 Body mass index (BMI) 27.0-27.9, adult: Secondary | ICD-10-CM | POA: Diagnosis not present

## 2018-12-15 DIAGNOSIS — Z01419 Encounter for gynecological examination (general) (routine) without abnormal findings: Secondary | ICD-10-CM | POA: Diagnosis not present

## 2019-05-23 DIAGNOSIS — H00021 Hordeolum internum right upper eyelid: Secondary | ICD-10-CM | POA: Diagnosis not present

## 2019-07-13 DIAGNOSIS — N760 Acute vaginitis: Secondary | ICD-10-CM | POA: Diagnosis not present

## 2019-08-15 DIAGNOSIS — M9901 Segmental and somatic dysfunction of cervical region: Secondary | ICD-10-CM | POA: Diagnosis not present

## 2019-08-15 DIAGNOSIS — M9903 Segmental and somatic dysfunction of lumbar region: Secondary | ICD-10-CM | POA: Diagnosis not present

## 2019-08-15 DIAGNOSIS — M9902 Segmental and somatic dysfunction of thoracic region: Secondary | ICD-10-CM | POA: Diagnosis not present

## 2019-08-15 DIAGNOSIS — M9905 Segmental and somatic dysfunction of pelvic region: Secondary | ICD-10-CM | POA: Diagnosis not present

## 2019-08-16 DIAGNOSIS — M9905 Segmental and somatic dysfunction of pelvic region: Secondary | ICD-10-CM | POA: Diagnosis not present

## 2019-08-16 DIAGNOSIS — M9903 Segmental and somatic dysfunction of lumbar region: Secondary | ICD-10-CM | POA: Diagnosis not present

## 2019-08-16 DIAGNOSIS — M9901 Segmental and somatic dysfunction of cervical region: Secondary | ICD-10-CM | POA: Diagnosis not present

## 2019-08-16 DIAGNOSIS — M9902 Segmental and somatic dysfunction of thoracic region: Secondary | ICD-10-CM | POA: Diagnosis not present

## 2019-08-18 DIAGNOSIS — M9901 Segmental and somatic dysfunction of cervical region: Secondary | ICD-10-CM | POA: Diagnosis not present

## 2019-08-18 DIAGNOSIS — M9902 Segmental and somatic dysfunction of thoracic region: Secondary | ICD-10-CM | POA: Diagnosis not present

## 2019-08-18 DIAGNOSIS — M9905 Segmental and somatic dysfunction of pelvic region: Secondary | ICD-10-CM | POA: Diagnosis not present

## 2019-08-18 DIAGNOSIS — M9903 Segmental and somatic dysfunction of lumbar region: Secondary | ICD-10-CM | POA: Diagnosis not present

## 2019-08-22 DIAGNOSIS — M9901 Segmental and somatic dysfunction of cervical region: Secondary | ICD-10-CM | POA: Diagnosis not present

## 2019-08-22 DIAGNOSIS — M9905 Segmental and somatic dysfunction of pelvic region: Secondary | ICD-10-CM | POA: Diagnosis not present

## 2019-08-22 DIAGNOSIS — M9902 Segmental and somatic dysfunction of thoracic region: Secondary | ICD-10-CM | POA: Diagnosis not present

## 2019-08-22 DIAGNOSIS — M9903 Segmental and somatic dysfunction of lumbar region: Secondary | ICD-10-CM | POA: Diagnosis not present

## 2019-08-26 DIAGNOSIS — M9905 Segmental and somatic dysfunction of pelvic region: Secondary | ICD-10-CM | POA: Diagnosis not present

## 2019-08-26 DIAGNOSIS — M9902 Segmental and somatic dysfunction of thoracic region: Secondary | ICD-10-CM | POA: Diagnosis not present

## 2019-08-26 DIAGNOSIS — M9903 Segmental and somatic dysfunction of lumbar region: Secondary | ICD-10-CM | POA: Diagnosis not present

## 2019-08-26 DIAGNOSIS — M9901 Segmental and somatic dysfunction of cervical region: Secondary | ICD-10-CM | POA: Diagnosis not present

## 2019-08-29 DIAGNOSIS — M9903 Segmental and somatic dysfunction of lumbar region: Secondary | ICD-10-CM | POA: Diagnosis not present

## 2019-08-29 DIAGNOSIS — M9902 Segmental and somatic dysfunction of thoracic region: Secondary | ICD-10-CM | POA: Diagnosis not present

## 2019-08-29 DIAGNOSIS — M9901 Segmental and somatic dysfunction of cervical region: Secondary | ICD-10-CM | POA: Diagnosis not present

## 2019-08-29 DIAGNOSIS — M9905 Segmental and somatic dysfunction of pelvic region: Secondary | ICD-10-CM | POA: Diagnosis not present

## 2019-09-02 DIAGNOSIS — M9902 Segmental and somatic dysfunction of thoracic region: Secondary | ICD-10-CM | POA: Diagnosis not present

## 2019-09-02 DIAGNOSIS — M9905 Segmental and somatic dysfunction of pelvic region: Secondary | ICD-10-CM | POA: Diagnosis not present

## 2019-09-02 DIAGNOSIS — M9901 Segmental and somatic dysfunction of cervical region: Secondary | ICD-10-CM | POA: Diagnosis not present

## 2019-09-02 DIAGNOSIS — M9903 Segmental and somatic dysfunction of lumbar region: Secondary | ICD-10-CM | POA: Diagnosis not present

## 2019-09-06 DIAGNOSIS — M9901 Segmental and somatic dysfunction of cervical region: Secondary | ICD-10-CM | POA: Diagnosis not present

## 2019-09-06 DIAGNOSIS — M9902 Segmental and somatic dysfunction of thoracic region: Secondary | ICD-10-CM | POA: Diagnosis not present

## 2019-09-06 DIAGNOSIS — M9905 Segmental and somatic dysfunction of pelvic region: Secondary | ICD-10-CM | POA: Diagnosis not present

## 2019-09-06 DIAGNOSIS — M9903 Segmental and somatic dysfunction of lumbar region: Secondary | ICD-10-CM | POA: Diagnosis not present

## 2019-09-09 DIAGNOSIS — M9903 Segmental and somatic dysfunction of lumbar region: Secondary | ICD-10-CM | POA: Diagnosis not present

## 2019-09-09 DIAGNOSIS — M9902 Segmental and somatic dysfunction of thoracic region: Secondary | ICD-10-CM | POA: Diagnosis not present

## 2019-09-09 DIAGNOSIS — M9905 Segmental and somatic dysfunction of pelvic region: Secondary | ICD-10-CM | POA: Diagnosis not present

## 2019-09-09 DIAGNOSIS — M9901 Segmental and somatic dysfunction of cervical region: Secondary | ICD-10-CM | POA: Diagnosis not present

## 2019-09-30 DIAGNOSIS — M9902 Segmental and somatic dysfunction of thoracic region: Secondary | ICD-10-CM | POA: Diagnosis not present

## 2019-09-30 DIAGNOSIS — M9905 Segmental and somatic dysfunction of pelvic region: Secondary | ICD-10-CM | POA: Diagnosis not present

## 2019-09-30 DIAGNOSIS — M9901 Segmental and somatic dysfunction of cervical region: Secondary | ICD-10-CM | POA: Diagnosis not present

## 2019-09-30 DIAGNOSIS — M9903 Segmental and somatic dysfunction of lumbar region: Secondary | ICD-10-CM | POA: Diagnosis not present

## 2019-10-07 DIAGNOSIS — M9901 Segmental and somatic dysfunction of cervical region: Secondary | ICD-10-CM | POA: Diagnosis not present

## 2019-10-07 DIAGNOSIS — M9902 Segmental and somatic dysfunction of thoracic region: Secondary | ICD-10-CM | POA: Diagnosis not present

## 2019-10-07 DIAGNOSIS — M9903 Segmental and somatic dysfunction of lumbar region: Secondary | ICD-10-CM | POA: Diagnosis not present

## 2019-10-07 DIAGNOSIS — M9905 Segmental and somatic dysfunction of pelvic region: Secondary | ICD-10-CM | POA: Diagnosis not present

## 2019-10-14 DIAGNOSIS — M9902 Segmental and somatic dysfunction of thoracic region: Secondary | ICD-10-CM | POA: Diagnosis not present

## 2019-10-14 DIAGNOSIS — M9903 Segmental and somatic dysfunction of lumbar region: Secondary | ICD-10-CM | POA: Diagnosis not present

## 2019-10-14 DIAGNOSIS — M9901 Segmental and somatic dysfunction of cervical region: Secondary | ICD-10-CM | POA: Diagnosis not present

## 2019-10-14 DIAGNOSIS — M9905 Segmental and somatic dysfunction of pelvic region: Secondary | ICD-10-CM | POA: Diagnosis not present

## 2019-10-21 DIAGNOSIS — M9902 Segmental and somatic dysfunction of thoracic region: Secondary | ICD-10-CM | POA: Diagnosis not present

## 2019-10-21 DIAGNOSIS — M9903 Segmental and somatic dysfunction of lumbar region: Secondary | ICD-10-CM | POA: Diagnosis not present

## 2019-10-21 DIAGNOSIS — M9905 Segmental and somatic dysfunction of pelvic region: Secondary | ICD-10-CM | POA: Diagnosis not present

## 2019-10-21 DIAGNOSIS — M9901 Segmental and somatic dysfunction of cervical region: Secondary | ICD-10-CM | POA: Diagnosis not present

## 2019-11-04 DIAGNOSIS — M9902 Segmental and somatic dysfunction of thoracic region: Secondary | ICD-10-CM | POA: Diagnosis not present

## 2019-11-04 DIAGNOSIS — M9901 Segmental and somatic dysfunction of cervical region: Secondary | ICD-10-CM | POA: Diagnosis not present

## 2019-11-04 DIAGNOSIS — M9905 Segmental and somatic dysfunction of pelvic region: Secondary | ICD-10-CM | POA: Diagnosis not present

## 2019-11-04 DIAGNOSIS — M9903 Segmental and somatic dysfunction of lumbar region: Secondary | ICD-10-CM | POA: Diagnosis not present

## 2019-11-24 DIAGNOSIS — M9905 Segmental and somatic dysfunction of pelvic region: Secondary | ICD-10-CM | POA: Diagnosis not present

## 2019-11-24 DIAGNOSIS — M9902 Segmental and somatic dysfunction of thoracic region: Secondary | ICD-10-CM | POA: Diagnosis not present

## 2019-11-24 DIAGNOSIS — M9901 Segmental and somatic dysfunction of cervical region: Secondary | ICD-10-CM | POA: Diagnosis not present

## 2019-11-24 DIAGNOSIS — M9903 Segmental and somatic dysfunction of lumbar region: Secondary | ICD-10-CM | POA: Diagnosis not present

## 2019-12-07 ENCOUNTER — Other Ambulatory Visit: Payer: BC Managed Care – PPO

## 2019-12-22 DIAGNOSIS — M9901 Segmental and somatic dysfunction of cervical region: Secondary | ICD-10-CM | POA: Diagnosis not present

## 2019-12-22 DIAGNOSIS — M9902 Segmental and somatic dysfunction of thoracic region: Secondary | ICD-10-CM | POA: Diagnosis not present

## 2019-12-22 DIAGNOSIS — M9905 Segmental and somatic dysfunction of pelvic region: Secondary | ICD-10-CM | POA: Diagnosis not present

## 2019-12-22 DIAGNOSIS — M9903 Segmental and somatic dysfunction of lumbar region: Secondary | ICD-10-CM | POA: Diagnosis not present

## 2019-12-23 DIAGNOSIS — Z01419 Encounter for gynecological examination (general) (routine) without abnormal findings: Secondary | ICD-10-CM | POA: Diagnosis not present

## 2019-12-23 DIAGNOSIS — Z6828 Body mass index (BMI) 28.0-28.9, adult: Secondary | ICD-10-CM | POA: Diagnosis not present

## 2020-02-06 DIAGNOSIS — M9905 Segmental and somatic dysfunction of pelvic region: Secondary | ICD-10-CM | POA: Diagnosis not present

## 2020-02-06 DIAGNOSIS — M9901 Segmental and somatic dysfunction of cervical region: Secondary | ICD-10-CM | POA: Diagnosis not present

## 2020-02-06 DIAGNOSIS — M9903 Segmental and somatic dysfunction of lumbar region: Secondary | ICD-10-CM | POA: Diagnosis not present

## 2020-02-06 DIAGNOSIS — M9902 Segmental and somatic dysfunction of thoracic region: Secondary | ICD-10-CM | POA: Diagnosis not present

## 2020-03-05 DIAGNOSIS — M9903 Segmental and somatic dysfunction of lumbar region: Secondary | ICD-10-CM | POA: Diagnosis not present

## 2020-03-05 DIAGNOSIS — M9905 Segmental and somatic dysfunction of pelvic region: Secondary | ICD-10-CM | POA: Diagnosis not present

## 2020-03-05 DIAGNOSIS — M9901 Segmental and somatic dysfunction of cervical region: Secondary | ICD-10-CM | POA: Diagnosis not present

## 2020-03-05 DIAGNOSIS — M9902 Segmental and somatic dysfunction of thoracic region: Secondary | ICD-10-CM | POA: Diagnosis not present

## 2020-03-29 DIAGNOSIS — M9903 Segmental and somatic dysfunction of lumbar region: Secondary | ICD-10-CM | POA: Diagnosis not present

## 2020-03-29 DIAGNOSIS — M9901 Segmental and somatic dysfunction of cervical region: Secondary | ICD-10-CM | POA: Diagnosis not present

## 2020-03-29 DIAGNOSIS — M9905 Segmental and somatic dysfunction of pelvic region: Secondary | ICD-10-CM | POA: Diagnosis not present

## 2020-03-29 DIAGNOSIS — M9902 Segmental and somatic dysfunction of thoracic region: Secondary | ICD-10-CM | POA: Diagnosis not present

## 2020-04-03 DIAGNOSIS — Z0000 Encounter for general adult medical examination without abnormal findings: Secondary | ICD-10-CM | POA: Diagnosis not present

## 2020-04-03 DIAGNOSIS — Z131 Encounter for screening for diabetes mellitus: Secondary | ICD-10-CM | POA: Diagnosis not present

## 2020-04-03 DIAGNOSIS — Z13220 Encounter for screening for lipoid disorders: Secondary | ICD-10-CM | POA: Diagnosis not present

## 2020-04-30 DIAGNOSIS — B373 Candidiasis of vulva and vagina: Secondary | ICD-10-CM | POA: Diagnosis not present

## 2020-04-30 DIAGNOSIS — N760 Acute vaginitis: Secondary | ICD-10-CM | POA: Diagnosis not present

## 2020-05-02 DIAGNOSIS — M9905 Segmental and somatic dysfunction of pelvic region: Secondary | ICD-10-CM | POA: Diagnosis not present

## 2020-05-02 DIAGNOSIS — M9901 Segmental and somatic dysfunction of cervical region: Secondary | ICD-10-CM | POA: Diagnosis not present

## 2020-05-02 DIAGNOSIS — M9903 Segmental and somatic dysfunction of lumbar region: Secondary | ICD-10-CM | POA: Diagnosis not present

## 2020-05-02 DIAGNOSIS — M9902 Segmental and somatic dysfunction of thoracic region: Secondary | ICD-10-CM | POA: Diagnosis not present

## 2020-06-06 DIAGNOSIS — M9903 Segmental and somatic dysfunction of lumbar region: Secondary | ICD-10-CM | POA: Diagnosis not present

## 2020-06-06 DIAGNOSIS — M9901 Segmental and somatic dysfunction of cervical region: Secondary | ICD-10-CM | POA: Diagnosis not present

## 2020-06-06 DIAGNOSIS — M9902 Segmental and somatic dysfunction of thoracic region: Secondary | ICD-10-CM | POA: Diagnosis not present

## 2020-06-06 DIAGNOSIS — M9905 Segmental and somatic dysfunction of pelvic region: Secondary | ICD-10-CM | POA: Diagnosis not present

## 2020-06-21 DIAGNOSIS — M9901 Segmental and somatic dysfunction of cervical region: Secondary | ICD-10-CM | POA: Diagnosis not present

## 2020-06-21 DIAGNOSIS — M9902 Segmental and somatic dysfunction of thoracic region: Secondary | ICD-10-CM | POA: Diagnosis not present

## 2020-06-21 DIAGNOSIS — M9905 Segmental and somatic dysfunction of pelvic region: Secondary | ICD-10-CM | POA: Diagnosis not present

## 2020-06-21 DIAGNOSIS — M9903 Segmental and somatic dysfunction of lumbar region: Secondary | ICD-10-CM | POA: Diagnosis not present

## 2020-07-04 DIAGNOSIS — M9903 Segmental and somatic dysfunction of lumbar region: Secondary | ICD-10-CM | POA: Diagnosis not present

## 2020-07-04 DIAGNOSIS — M9901 Segmental and somatic dysfunction of cervical region: Secondary | ICD-10-CM | POA: Diagnosis not present

## 2020-07-04 DIAGNOSIS — M9905 Segmental and somatic dysfunction of pelvic region: Secondary | ICD-10-CM | POA: Diagnosis not present

## 2020-07-04 DIAGNOSIS — M9902 Segmental and somatic dysfunction of thoracic region: Secondary | ICD-10-CM | POA: Diagnosis not present

## 2020-08-09 DIAGNOSIS — M9905 Segmental and somatic dysfunction of pelvic region: Secondary | ICD-10-CM | POA: Diagnosis not present

## 2020-08-09 DIAGNOSIS — M9902 Segmental and somatic dysfunction of thoracic region: Secondary | ICD-10-CM | POA: Diagnosis not present

## 2020-08-09 DIAGNOSIS — M9901 Segmental and somatic dysfunction of cervical region: Secondary | ICD-10-CM | POA: Diagnosis not present

## 2020-08-09 DIAGNOSIS — M9903 Segmental and somatic dysfunction of lumbar region: Secondary | ICD-10-CM | POA: Diagnosis not present

## 2020-08-31 DIAGNOSIS — Z23 Encounter for immunization: Secondary | ICD-10-CM | POA: Diagnosis not present

## 2020-09-06 DIAGNOSIS — M9903 Segmental and somatic dysfunction of lumbar region: Secondary | ICD-10-CM | POA: Diagnosis not present

## 2020-09-06 DIAGNOSIS — M9902 Segmental and somatic dysfunction of thoracic region: Secondary | ICD-10-CM | POA: Diagnosis not present

## 2020-09-06 DIAGNOSIS — M9905 Segmental and somatic dysfunction of pelvic region: Secondary | ICD-10-CM | POA: Diagnosis not present

## 2020-09-06 DIAGNOSIS — M9901 Segmental and somatic dysfunction of cervical region: Secondary | ICD-10-CM | POA: Diagnosis not present

## 2020-10-01 DIAGNOSIS — Z1283 Encounter for screening for malignant neoplasm of skin: Secondary | ICD-10-CM | POA: Diagnosis not present

## 2020-10-01 DIAGNOSIS — L918 Other hypertrophic disorders of the skin: Secondary | ICD-10-CM | POA: Diagnosis not present

## 2020-10-01 DIAGNOSIS — D225 Melanocytic nevi of trunk: Secondary | ICD-10-CM | POA: Diagnosis not present

## 2020-10-04 DIAGNOSIS — M9905 Segmental and somatic dysfunction of pelvic region: Secondary | ICD-10-CM | POA: Diagnosis not present

## 2020-10-04 DIAGNOSIS — M9901 Segmental and somatic dysfunction of cervical region: Secondary | ICD-10-CM | POA: Diagnosis not present

## 2020-10-04 DIAGNOSIS — M9902 Segmental and somatic dysfunction of thoracic region: Secondary | ICD-10-CM | POA: Diagnosis not present

## 2020-10-04 DIAGNOSIS — M9903 Segmental and somatic dysfunction of lumbar region: Secondary | ICD-10-CM | POA: Diagnosis not present

## 2020-10-31 DIAGNOSIS — M9905 Segmental and somatic dysfunction of pelvic region: Secondary | ICD-10-CM | POA: Diagnosis not present

## 2020-10-31 DIAGNOSIS — M9902 Segmental and somatic dysfunction of thoracic region: Secondary | ICD-10-CM | POA: Diagnosis not present

## 2020-10-31 DIAGNOSIS — M9903 Segmental and somatic dysfunction of lumbar region: Secondary | ICD-10-CM | POA: Diagnosis not present

## 2020-10-31 DIAGNOSIS — M9901 Segmental and somatic dysfunction of cervical region: Secondary | ICD-10-CM | POA: Diagnosis not present

## 2020-12-19 DIAGNOSIS — M9901 Segmental and somatic dysfunction of cervical region: Secondary | ICD-10-CM | POA: Diagnosis not present

## 2020-12-19 DIAGNOSIS — M9902 Segmental and somatic dysfunction of thoracic region: Secondary | ICD-10-CM | POA: Diagnosis not present

## 2020-12-19 DIAGNOSIS — M9903 Segmental and somatic dysfunction of lumbar region: Secondary | ICD-10-CM | POA: Diagnosis not present

## 2020-12-19 DIAGNOSIS — M9905 Segmental and somatic dysfunction of pelvic region: Secondary | ICD-10-CM | POA: Diagnosis not present

## 2021-01-16 DIAGNOSIS — M9902 Segmental and somatic dysfunction of thoracic region: Secondary | ICD-10-CM | POA: Diagnosis not present

## 2021-01-16 DIAGNOSIS — M9901 Segmental and somatic dysfunction of cervical region: Secondary | ICD-10-CM | POA: Diagnosis not present

## 2021-01-16 DIAGNOSIS — M9905 Segmental and somatic dysfunction of pelvic region: Secondary | ICD-10-CM | POA: Diagnosis not present

## 2021-01-16 DIAGNOSIS — M9903 Segmental and somatic dysfunction of lumbar region: Secondary | ICD-10-CM | POA: Diagnosis not present

## 2021-02-19 DIAGNOSIS — Z1231 Encounter for screening mammogram for malignant neoplasm of breast: Secondary | ICD-10-CM | POA: Diagnosis not present

## 2021-02-19 DIAGNOSIS — Z01419 Encounter for gynecological examination (general) (routine) without abnormal findings: Secondary | ICD-10-CM | POA: Diagnosis not present

## 2021-02-19 DIAGNOSIS — Z30432 Encounter for removal of intrauterine contraceptive device: Secondary | ICD-10-CM | POA: Diagnosis not present

## 2021-02-19 DIAGNOSIS — Z6828 Body mass index (BMI) 28.0-28.9, adult: Secondary | ICD-10-CM | POA: Diagnosis not present

## 2021-02-20 DIAGNOSIS — M9901 Segmental and somatic dysfunction of cervical region: Secondary | ICD-10-CM | POA: Diagnosis not present

## 2021-02-20 DIAGNOSIS — M9902 Segmental and somatic dysfunction of thoracic region: Secondary | ICD-10-CM | POA: Diagnosis not present

## 2021-02-20 DIAGNOSIS — M9905 Segmental and somatic dysfunction of pelvic region: Secondary | ICD-10-CM | POA: Diagnosis not present

## 2021-02-20 DIAGNOSIS — M9903 Segmental and somatic dysfunction of lumbar region: Secondary | ICD-10-CM | POA: Diagnosis not present

## 2021-03-27 DIAGNOSIS — M9903 Segmental and somatic dysfunction of lumbar region: Secondary | ICD-10-CM | POA: Diagnosis not present

## 2021-03-27 DIAGNOSIS — M9901 Segmental and somatic dysfunction of cervical region: Secondary | ICD-10-CM | POA: Diagnosis not present

## 2021-03-27 DIAGNOSIS — M9902 Segmental and somatic dysfunction of thoracic region: Secondary | ICD-10-CM | POA: Diagnosis not present

## 2021-03-27 DIAGNOSIS — M9905 Segmental and somatic dysfunction of pelvic region: Secondary | ICD-10-CM | POA: Diagnosis not present

## 2021-04-11 DIAGNOSIS — Z0000 Encounter for general adult medical examination without abnormal findings: Secondary | ICD-10-CM | POA: Diagnosis not present

## 2021-04-11 DIAGNOSIS — Z131 Encounter for screening for diabetes mellitus: Secondary | ICD-10-CM | POA: Diagnosis not present

## 2021-04-11 DIAGNOSIS — F418 Other specified anxiety disorders: Secondary | ICD-10-CM | POA: Diagnosis not present

## 2021-04-11 DIAGNOSIS — Z13220 Encounter for screening for lipoid disorders: Secondary | ICD-10-CM | POA: Diagnosis not present

## 2021-05-01 DIAGNOSIS — M9905 Segmental and somatic dysfunction of pelvic region: Secondary | ICD-10-CM | POA: Diagnosis not present

## 2021-05-01 DIAGNOSIS — M9903 Segmental and somatic dysfunction of lumbar region: Secondary | ICD-10-CM | POA: Diagnosis not present

## 2021-05-01 DIAGNOSIS — M9901 Segmental and somatic dysfunction of cervical region: Secondary | ICD-10-CM | POA: Diagnosis not present

## 2021-05-01 DIAGNOSIS — M9902 Segmental and somatic dysfunction of thoracic region: Secondary | ICD-10-CM | POA: Diagnosis not present

## 2021-06-21 DIAGNOSIS — M9905 Segmental and somatic dysfunction of pelvic region: Secondary | ICD-10-CM | POA: Diagnosis not present

## 2021-06-21 DIAGNOSIS — M9901 Segmental and somatic dysfunction of cervical region: Secondary | ICD-10-CM | POA: Diagnosis not present

## 2021-06-21 DIAGNOSIS — M9903 Segmental and somatic dysfunction of lumbar region: Secondary | ICD-10-CM | POA: Diagnosis not present

## 2021-06-21 DIAGNOSIS — M9902 Segmental and somatic dysfunction of thoracic region: Secondary | ICD-10-CM | POA: Diagnosis not present

## 2021-06-25 DIAGNOSIS — M9903 Segmental and somatic dysfunction of lumbar region: Secondary | ICD-10-CM | POA: Diagnosis not present

## 2021-06-25 DIAGNOSIS — M9902 Segmental and somatic dysfunction of thoracic region: Secondary | ICD-10-CM | POA: Diagnosis not present

## 2021-06-25 DIAGNOSIS — M9901 Segmental and somatic dysfunction of cervical region: Secondary | ICD-10-CM | POA: Diagnosis not present

## 2021-06-25 DIAGNOSIS — M9905 Segmental and somatic dysfunction of pelvic region: Secondary | ICD-10-CM | POA: Diagnosis not present

## 2021-07-02 DIAGNOSIS — M9902 Segmental and somatic dysfunction of thoracic region: Secondary | ICD-10-CM | POA: Diagnosis not present

## 2021-07-02 DIAGNOSIS — M9903 Segmental and somatic dysfunction of lumbar region: Secondary | ICD-10-CM | POA: Diagnosis not present

## 2021-07-02 DIAGNOSIS — M9905 Segmental and somatic dysfunction of pelvic region: Secondary | ICD-10-CM | POA: Diagnosis not present

## 2021-07-02 DIAGNOSIS — M9901 Segmental and somatic dysfunction of cervical region: Secondary | ICD-10-CM | POA: Diagnosis not present

## 2021-07-24 DIAGNOSIS — M9901 Segmental and somatic dysfunction of cervical region: Secondary | ICD-10-CM | POA: Diagnosis not present

## 2021-07-24 DIAGNOSIS — M9903 Segmental and somatic dysfunction of lumbar region: Secondary | ICD-10-CM | POA: Diagnosis not present

## 2021-07-24 DIAGNOSIS — M9902 Segmental and somatic dysfunction of thoracic region: Secondary | ICD-10-CM | POA: Diagnosis not present

## 2021-07-24 DIAGNOSIS — M9905 Segmental and somatic dysfunction of pelvic region: Secondary | ICD-10-CM | POA: Diagnosis not present

## 2021-07-30 DIAGNOSIS — R42 Dizziness and giddiness: Secondary | ICD-10-CM | POA: Diagnosis not present

## 2021-08-14 DIAGNOSIS — M9902 Segmental and somatic dysfunction of thoracic region: Secondary | ICD-10-CM | POA: Diagnosis not present

## 2021-08-14 DIAGNOSIS — M9903 Segmental and somatic dysfunction of lumbar region: Secondary | ICD-10-CM | POA: Diagnosis not present

## 2021-08-14 DIAGNOSIS — M9905 Segmental and somatic dysfunction of pelvic region: Secondary | ICD-10-CM | POA: Diagnosis not present

## 2021-08-14 DIAGNOSIS — M9901 Segmental and somatic dysfunction of cervical region: Secondary | ICD-10-CM | POA: Diagnosis not present

## 2021-09-04 DIAGNOSIS — M9902 Segmental and somatic dysfunction of thoracic region: Secondary | ICD-10-CM | POA: Diagnosis not present

## 2021-09-04 DIAGNOSIS — M9903 Segmental and somatic dysfunction of lumbar region: Secondary | ICD-10-CM | POA: Diagnosis not present

## 2021-09-04 DIAGNOSIS — M9905 Segmental and somatic dysfunction of pelvic region: Secondary | ICD-10-CM | POA: Diagnosis not present

## 2021-09-04 DIAGNOSIS — M9901 Segmental and somatic dysfunction of cervical region: Secondary | ICD-10-CM | POA: Diagnosis not present

## 2021-09-23 DIAGNOSIS — M9901 Segmental and somatic dysfunction of cervical region: Secondary | ICD-10-CM | POA: Diagnosis not present

## 2021-09-23 DIAGNOSIS — M9903 Segmental and somatic dysfunction of lumbar region: Secondary | ICD-10-CM | POA: Diagnosis not present

## 2021-09-23 DIAGNOSIS — M9905 Segmental and somatic dysfunction of pelvic region: Secondary | ICD-10-CM | POA: Diagnosis not present

## 2021-09-23 DIAGNOSIS — M9902 Segmental and somatic dysfunction of thoracic region: Secondary | ICD-10-CM | POA: Diagnosis not present

## 2021-10-02 DIAGNOSIS — Z713 Dietary counseling and surveillance: Secondary | ICD-10-CM | POA: Diagnosis not present

## 2021-10-04 DIAGNOSIS — J3089 Other allergic rhinitis: Secondary | ICD-10-CM | POA: Diagnosis not present

## 2021-10-04 DIAGNOSIS — J3081 Allergic rhinitis due to animal (cat) (dog) hair and dander: Secondary | ICD-10-CM | POA: Diagnosis not present

## 2021-10-04 DIAGNOSIS — H1045 Other chronic allergic conjunctivitis: Secondary | ICD-10-CM | POA: Diagnosis not present

## 2021-10-04 DIAGNOSIS — J301 Allergic rhinitis due to pollen: Secondary | ICD-10-CM | POA: Diagnosis not present

## 2021-10-14 DIAGNOSIS — M9902 Segmental and somatic dysfunction of thoracic region: Secondary | ICD-10-CM | POA: Diagnosis not present

## 2021-10-14 DIAGNOSIS — M9901 Segmental and somatic dysfunction of cervical region: Secondary | ICD-10-CM | POA: Diagnosis not present

## 2021-10-14 DIAGNOSIS — M9903 Segmental and somatic dysfunction of lumbar region: Secondary | ICD-10-CM | POA: Diagnosis not present

## 2021-10-14 DIAGNOSIS — M9905 Segmental and somatic dysfunction of pelvic region: Secondary | ICD-10-CM | POA: Diagnosis not present

## 2021-10-21 DIAGNOSIS — Z713 Dietary counseling and surveillance: Secondary | ICD-10-CM | POA: Diagnosis not present

## 2021-11-20 DIAGNOSIS — M9903 Segmental and somatic dysfunction of lumbar region: Secondary | ICD-10-CM | POA: Diagnosis not present

## 2021-11-20 DIAGNOSIS — M9902 Segmental and somatic dysfunction of thoracic region: Secondary | ICD-10-CM | POA: Diagnosis not present

## 2021-11-20 DIAGNOSIS — M9901 Segmental and somatic dysfunction of cervical region: Secondary | ICD-10-CM | POA: Diagnosis not present

## 2021-11-20 DIAGNOSIS — M9905 Segmental and somatic dysfunction of pelvic region: Secondary | ICD-10-CM | POA: Diagnosis not present

## 2021-12-05 DIAGNOSIS — M9903 Segmental and somatic dysfunction of lumbar region: Secondary | ICD-10-CM | POA: Diagnosis not present

## 2021-12-05 DIAGNOSIS — M9905 Segmental and somatic dysfunction of pelvic region: Secondary | ICD-10-CM | POA: Diagnosis not present

## 2021-12-05 DIAGNOSIS — M9902 Segmental and somatic dysfunction of thoracic region: Secondary | ICD-10-CM | POA: Diagnosis not present

## 2021-12-05 DIAGNOSIS — M9901 Segmental and somatic dysfunction of cervical region: Secondary | ICD-10-CM | POA: Diagnosis not present

## 2021-12-12 DIAGNOSIS — N760 Acute vaginitis: Secondary | ICD-10-CM | POA: Diagnosis not present

## 2021-12-12 DIAGNOSIS — R300 Dysuria: Secondary | ICD-10-CM | POA: Diagnosis not present

## 2021-12-26 DIAGNOSIS — M9902 Segmental and somatic dysfunction of thoracic region: Secondary | ICD-10-CM | POA: Diagnosis not present

## 2021-12-26 DIAGNOSIS — M9905 Segmental and somatic dysfunction of pelvic region: Secondary | ICD-10-CM | POA: Diagnosis not present

## 2021-12-26 DIAGNOSIS — M9901 Segmental and somatic dysfunction of cervical region: Secondary | ICD-10-CM | POA: Diagnosis not present

## 2021-12-26 DIAGNOSIS — M9903 Segmental and somatic dysfunction of lumbar region: Secondary | ICD-10-CM | POA: Diagnosis not present

## 2022-01-08 DIAGNOSIS — M9903 Segmental and somatic dysfunction of lumbar region: Secondary | ICD-10-CM | POA: Diagnosis not present

## 2022-01-08 DIAGNOSIS — M9901 Segmental and somatic dysfunction of cervical region: Secondary | ICD-10-CM | POA: Diagnosis not present

## 2022-01-08 DIAGNOSIS — M9905 Segmental and somatic dysfunction of pelvic region: Secondary | ICD-10-CM | POA: Diagnosis not present

## 2022-01-08 DIAGNOSIS — M9902 Segmental and somatic dysfunction of thoracic region: Secondary | ICD-10-CM | POA: Diagnosis not present

## 2022-02-03 DIAGNOSIS — M9905 Segmental and somatic dysfunction of pelvic region: Secondary | ICD-10-CM | POA: Diagnosis not present

## 2022-02-03 DIAGNOSIS — M9902 Segmental and somatic dysfunction of thoracic region: Secondary | ICD-10-CM | POA: Diagnosis not present

## 2022-02-03 DIAGNOSIS — M9901 Segmental and somatic dysfunction of cervical region: Secondary | ICD-10-CM | POA: Diagnosis not present

## 2022-02-03 DIAGNOSIS — M9903 Segmental and somatic dysfunction of lumbar region: Secondary | ICD-10-CM | POA: Diagnosis not present

## 2022-02-05 DIAGNOSIS — M9905 Segmental and somatic dysfunction of pelvic region: Secondary | ICD-10-CM | POA: Diagnosis not present

## 2022-02-05 DIAGNOSIS — M9902 Segmental and somatic dysfunction of thoracic region: Secondary | ICD-10-CM | POA: Diagnosis not present

## 2022-02-05 DIAGNOSIS — M9901 Segmental and somatic dysfunction of cervical region: Secondary | ICD-10-CM | POA: Diagnosis not present

## 2022-02-05 DIAGNOSIS — M9903 Segmental and somatic dysfunction of lumbar region: Secondary | ICD-10-CM | POA: Diagnosis not present

## 2022-02-13 DIAGNOSIS — M9905 Segmental and somatic dysfunction of pelvic region: Secondary | ICD-10-CM | POA: Diagnosis not present

## 2022-02-13 DIAGNOSIS — M9903 Segmental and somatic dysfunction of lumbar region: Secondary | ICD-10-CM | POA: Diagnosis not present

## 2022-02-13 DIAGNOSIS — M9902 Segmental and somatic dysfunction of thoracic region: Secondary | ICD-10-CM | POA: Diagnosis not present

## 2022-02-13 DIAGNOSIS — M9901 Segmental and somatic dysfunction of cervical region: Secondary | ICD-10-CM | POA: Diagnosis not present

## 2022-03-10 DIAGNOSIS — Z124 Encounter for screening for malignant neoplasm of cervix: Secondary | ICD-10-CM | POA: Diagnosis not present

## 2022-03-10 DIAGNOSIS — Z01419 Encounter for gynecological examination (general) (routine) without abnormal findings: Secondary | ICD-10-CM | POA: Diagnosis not present

## 2022-03-10 DIAGNOSIS — Z1231 Encounter for screening mammogram for malignant neoplasm of breast: Secondary | ICD-10-CM | POA: Diagnosis not present

## 2022-03-10 DIAGNOSIS — Z6827 Body mass index (BMI) 27.0-27.9, adult: Secondary | ICD-10-CM | POA: Diagnosis not present

## 2022-03-10 DIAGNOSIS — N912 Amenorrhea, unspecified: Secondary | ICD-10-CM | POA: Diagnosis not present

## 2022-03-10 DIAGNOSIS — Z87410 Personal history of cervical dysplasia: Secondary | ICD-10-CM | POA: Diagnosis not present

## 2022-03-12 ENCOUNTER — Other Ambulatory Visit: Payer: Self-pay | Admitting: Obstetrics and Gynecology

## 2022-03-12 DIAGNOSIS — R928 Other abnormal and inconclusive findings on diagnostic imaging of breast: Secondary | ICD-10-CM

## 2022-03-22 ENCOUNTER — Ambulatory Visit
Admission: RE | Admit: 2022-03-22 | Discharge: 2022-03-22 | Disposition: A | Discharge status: Home / Self Care | Payer: BC Managed Care – PPO | Source: Ambulatory Visit | Attending: Obstetrics and Gynecology | Admitting: Obstetrics and Gynecology

## 2022-03-22 ENCOUNTER — Ambulatory Visit: Payer: BC Managed Care – PPO

## 2022-03-22 DIAGNOSIS — R928 Other abnormal and inconclusive findings on diagnostic imaging of breast: Secondary | ICD-10-CM

## 2022-03-22 DIAGNOSIS — R922 Inconclusive mammogram: Secondary | ICD-10-CM | POA: Diagnosis not present

## 2022-04-02 DIAGNOSIS — M9905 Segmental and somatic dysfunction of pelvic region: Secondary | ICD-10-CM | POA: Diagnosis not present

## 2022-04-02 DIAGNOSIS — M9902 Segmental and somatic dysfunction of thoracic region: Secondary | ICD-10-CM | POA: Diagnosis not present

## 2022-04-02 DIAGNOSIS — M9903 Segmental and somatic dysfunction of lumbar region: Secondary | ICD-10-CM | POA: Diagnosis not present

## 2022-04-02 DIAGNOSIS — M9901 Segmental and somatic dysfunction of cervical region: Secondary | ICD-10-CM | POA: Diagnosis not present

## 2022-04-03 DIAGNOSIS — L218 Other seborrheic dermatitis: Secondary | ICD-10-CM | POA: Diagnosis not present

## 2022-04-03 DIAGNOSIS — L249 Irritant contact dermatitis, unspecified cause: Secondary | ICD-10-CM | POA: Diagnosis not present

## 2022-05-06 DIAGNOSIS — M9901 Segmental and somatic dysfunction of cervical region: Secondary | ICD-10-CM | POA: Diagnosis not present

## 2022-05-06 DIAGNOSIS — M9905 Segmental and somatic dysfunction of pelvic region: Secondary | ICD-10-CM | POA: Diagnosis not present

## 2022-05-06 DIAGNOSIS — M9903 Segmental and somatic dysfunction of lumbar region: Secondary | ICD-10-CM | POA: Diagnosis not present

## 2022-05-06 DIAGNOSIS — M9902 Segmental and somatic dysfunction of thoracic region: Secondary | ICD-10-CM | POA: Diagnosis not present

## 2022-06-03 DIAGNOSIS — M9905 Segmental and somatic dysfunction of pelvic region: Secondary | ICD-10-CM | POA: Diagnosis not present

## 2022-06-03 DIAGNOSIS — M9903 Segmental and somatic dysfunction of lumbar region: Secondary | ICD-10-CM | POA: Diagnosis not present

## 2022-06-03 DIAGNOSIS — M9901 Segmental and somatic dysfunction of cervical region: Secondary | ICD-10-CM | POA: Diagnosis not present

## 2022-06-03 DIAGNOSIS — M9902 Segmental and somatic dysfunction of thoracic region: Secondary | ICD-10-CM | POA: Diagnosis not present

## 2022-07-03 DIAGNOSIS — M9902 Segmental and somatic dysfunction of thoracic region: Secondary | ICD-10-CM | POA: Diagnosis not present

## 2022-07-03 DIAGNOSIS — M9901 Segmental and somatic dysfunction of cervical region: Secondary | ICD-10-CM | POA: Diagnosis not present

## 2022-07-03 DIAGNOSIS — M9903 Segmental and somatic dysfunction of lumbar region: Secondary | ICD-10-CM | POA: Diagnosis not present

## 2022-07-03 DIAGNOSIS — M9905 Segmental and somatic dysfunction of pelvic region: Secondary | ICD-10-CM | POA: Diagnosis not present

## 2022-07-18 DIAGNOSIS — M9905 Segmental and somatic dysfunction of pelvic region: Secondary | ICD-10-CM | POA: Diagnosis not present

## 2022-07-18 DIAGNOSIS — M9901 Segmental and somatic dysfunction of cervical region: Secondary | ICD-10-CM | POA: Diagnosis not present

## 2022-07-18 DIAGNOSIS — M9902 Segmental and somatic dysfunction of thoracic region: Secondary | ICD-10-CM | POA: Diagnosis not present

## 2022-07-18 DIAGNOSIS — M9903 Segmental and somatic dysfunction of lumbar region: Secondary | ICD-10-CM | POA: Diagnosis not present

## 2022-08-19 DIAGNOSIS — M9901 Segmental and somatic dysfunction of cervical region: Secondary | ICD-10-CM | POA: Diagnosis not present

## 2022-08-19 DIAGNOSIS — M9903 Segmental and somatic dysfunction of lumbar region: Secondary | ICD-10-CM | POA: Diagnosis not present

## 2022-08-19 DIAGNOSIS — M9905 Segmental and somatic dysfunction of pelvic region: Secondary | ICD-10-CM | POA: Diagnosis not present

## 2022-08-19 DIAGNOSIS — M9902 Segmental and somatic dysfunction of thoracic region: Secondary | ICD-10-CM | POA: Diagnosis not present

## 2022-09-01 DIAGNOSIS — M9903 Segmental and somatic dysfunction of lumbar region: Secondary | ICD-10-CM | POA: Diagnosis not present

## 2022-09-01 DIAGNOSIS — M531 Cervicobrachial syndrome: Secondary | ICD-10-CM | POA: Diagnosis not present

## 2022-09-01 DIAGNOSIS — M9905 Segmental and somatic dysfunction of pelvic region: Secondary | ICD-10-CM | POA: Diagnosis not present

## 2022-09-01 DIAGNOSIS — M9901 Segmental and somatic dysfunction of cervical region: Secondary | ICD-10-CM | POA: Diagnosis not present

## 2022-09-04 DIAGNOSIS — M531 Cervicobrachial syndrome: Secondary | ICD-10-CM | POA: Diagnosis not present

## 2022-09-04 DIAGNOSIS — M9901 Segmental and somatic dysfunction of cervical region: Secondary | ICD-10-CM | POA: Diagnosis not present

## 2022-09-04 DIAGNOSIS — M9905 Segmental and somatic dysfunction of pelvic region: Secondary | ICD-10-CM | POA: Diagnosis not present

## 2022-09-04 DIAGNOSIS — M9903 Segmental and somatic dysfunction of lumbar region: Secondary | ICD-10-CM | POA: Diagnosis not present

## 2022-09-12 DIAGNOSIS — Z6829 Body mass index (BMI) 29.0-29.9, adult: Secondary | ICD-10-CM | POA: Diagnosis not present

## 2022-09-12 DIAGNOSIS — F418 Other specified anxiety disorders: Secondary | ICD-10-CM | POA: Diagnosis not present

## 2022-09-12 DIAGNOSIS — D239 Other benign neoplasm of skin, unspecified: Secondary | ICD-10-CM | POA: Diagnosis not present

## 2022-09-12 DIAGNOSIS — Z0000 Encounter for general adult medical examination without abnormal findings: Secondary | ICD-10-CM | POA: Diagnosis not present

## 2022-09-12 DIAGNOSIS — Z13220 Encounter for screening for lipoid disorders: Secondary | ICD-10-CM | POA: Diagnosis not present

## 2022-10-06 DIAGNOSIS — Z1283 Encounter for screening for malignant neoplasm of skin: Secondary | ICD-10-CM | POA: Diagnosis not present

## 2022-10-06 DIAGNOSIS — L218 Other seborrheic dermatitis: Secondary | ICD-10-CM | POA: Diagnosis not present

## 2022-10-06 DIAGNOSIS — D485 Neoplasm of uncertain behavior of skin: Secondary | ICD-10-CM | POA: Diagnosis not present

## 2022-10-06 DIAGNOSIS — D225 Melanocytic nevi of trunk: Secondary | ICD-10-CM | POA: Diagnosis not present

## 2022-10-16 DIAGNOSIS — M9901 Segmental and somatic dysfunction of cervical region: Secondary | ICD-10-CM | POA: Diagnosis not present

## 2022-10-16 DIAGNOSIS — M9905 Segmental and somatic dysfunction of pelvic region: Secondary | ICD-10-CM | POA: Diagnosis not present

## 2022-10-16 DIAGNOSIS — M9903 Segmental and somatic dysfunction of lumbar region: Secondary | ICD-10-CM | POA: Diagnosis not present

## 2022-10-16 DIAGNOSIS — M531 Cervicobrachial syndrome: Secondary | ICD-10-CM | POA: Diagnosis not present

## 2022-10-21 DIAGNOSIS — J3089 Other allergic rhinitis: Secondary | ICD-10-CM | POA: Diagnosis not present

## 2022-10-21 DIAGNOSIS — H1045 Other chronic allergic conjunctivitis: Secondary | ICD-10-CM | POA: Diagnosis not present

## 2022-10-21 DIAGNOSIS — J45998 Other asthma: Secondary | ICD-10-CM | POA: Diagnosis not present

## 2022-10-21 DIAGNOSIS — R062 Wheezing: Secondary | ICD-10-CM | POA: Diagnosis not present

## 2022-10-21 DIAGNOSIS — J301 Allergic rhinitis due to pollen: Secondary | ICD-10-CM | POA: Diagnosis not present

## 2022-10-21 DIAGNOSIS — J3081 Allergic rhinitis due to animal (cat) (dog) hair and dander: Secondary | ICD-10-CM | POA: Diagnosis not present

## 2022-10-23 NOTE — Progress Notes (Signed)
CARDIOLOGY CONSULT NOTE       Patient ID: Brandi Snyder MRN: [id number] DOB/AGE: [birth year] 41 y.o.  Admit date: (Not on file) Referring Physician: Amado Nash FNP Primary Physician: Aretta Nip, MD Primary Cardiologist: New Reason for Consultation: Family History Premature CAD  Active Problems:   * No active hospital problems. *   HPI:  41 y.o. referred by Olegario Messier FNP for family history of premature CAD. Seen by Dr Wynonia Lawman over 10 years ago ? Hypertrophic cardiomyopathy No records available Increased anxiety over mothers health seeing therapist Recent lipids show LDL 131 HDL 41 TC 203 Advised plant based diet in past  Note from primary indicates TTE 10/22/2015 EF 60% mild LVH no HOCM and trace MR On report septal thickness only 11 mm   Mom passed a month ago in [place] She is from [place] and met husband there She works from  Home in [place] as does her husband who is in recruiting. They have a 7/42 yo at home She acknowledges anxiety as an issue and has a therapist now   ROS All other systems reviewed and negative except as noted above  Past Medical History:  Diagnosis Date   Asthma due to environmental allergies    Situational anxiety     History reviewed. No pertinent family history.  Social History   Socioeconomic History   Marital status: Married    Spouse name: Not on file   Number of children: Not on file   Years of education: Not on file   Highest education level: Not on file  Occupational History   Not on file  Tobacco Use   Smoking status: Never   Smokeless tobacco: Never  Vaping Use   Vaping Use: Never used  Substance and Sexual Activity   Alcohol use: No   Drug use: No   Sexual activity: Yes  Other Topics Concern   Not on file  Social History Narrative   Not on file   Social Determinants of Health   Financial Resource Strain: Not on file  Food Insecurity: Not on file  Transportation Needs: Not on file  Physical Activity:  Not on file  Stress: Not on file  Social Connections: Not on file  Intimate Partner Violence: Not on file    History reviewed. No pertinent surgical history.    Current Outpatient Medications:    budesonide-formoterol (SYMBICORT) 80-4.5 MCG/ACT inhaler, Inhale 2 puffs into the lungs daily., Disp: , Rfl:    cetirizine (ZYRTEC) 10 MG tablet, Take 10 mg by mouth daily., Disp: , Rfl:    fluticasone (FLONASE) 50 MCG/ACT nasal spray, Place 1 spray into both nostrils daily., Disp: , Rfl:    ibuprofen (ADVIL,MOTRIN) 600 MG tablet, Take 1 tablet (600 mg total) by mouth every 6 (six) hours as needed., Disp: 30 tablet, Rfl: 0    Physical Exam: Blood pressure 108/62, pulse 84, height _0  (1.575 m), weight 161 lb (73 kg), SpO2 97 %, unknown if currently breastfeeding.    Affect appropriate Healthy:  appears stated age 41: normal Neck supple with no adenopathy JVP normal no bruits no thyromegaly Lungs clear with no wheezing and good diaphragmatic motion Heart:  S1/S2 no murmur, no rub, gallop or click PMI normal Abdomen: benighn, BS positve, no tenderness, no AAA no bruit.  No HSM or HJR Distal pulses intact with no bruits No edema Neuro non-focal Skin warm and dry No muscular weakness   Labs:   Lab Results  Component Value Date  WBC 13.2 (H) 08/09/2015   HGB 10.9 (L) 08/09/2015   HCT 33.6 (L) 08/09/2015   MCV 84.4 08/09/2015   PLT 142 (L) 08/09/2015   No results for input(s): "NA", "K", "CL", "CO2", "BUN", "CREATININE", "CALCIUM", "PROT", "BILITOT", "ALKPHOS", "ALT", "AST", "GLUCOSE" in the last 168 hours.  Invalid input(s): "LABALBU" No results found for: "CKTOTAL", "CKMB", "CKMBINDEX", "TROPONINI" No results found for: "CHOL" No results found for: "HDL" No results found for: "LDLCALC" No results found for: "TRIG" No results found for: "CHOLHDL" No results found for: "LDLDIRECT"    Radiology: No results found.  EKG: none done    ASSESSMENT AND PLAN:   Family  History Premature CAD:  Normal exam No history of vascular issues.  Advised coronary calcium score HLD:  LDL 131 will make recs about Rx based on calcium score  Anxiety:  situation continue with therapist and primary  LVH:  mild on TTE 2016 no HTN update TTE    Calcium Score TTE   F/U PRN pending studies   Signed: Jenkins Rouge 11/03/2022, 8:21 AM

## 2022-10-30 DIAGNOSIS — M9905 Segmental and somatic dysfunction of pelvic region: Secondary | ICD-10-CM | POA: Diagnosis not present

## 2022-10-30 DIAGNOSIS — M9903 Segmental and somatic dysfunction of lumbar region: Secondary | ICD-10-CM | POA: Diagnosis not present

## 2022-10-30 DIAGNOSIS — M531 Cervicobrachial syndrome: Secondary | ICD-10-CM | POA: Diagnosis not present

## 2022-10-30 DIAGNOSIS — M9901 Segmental and somatic dysfunction of cervical region: Secondary | ICD-10-CM | POA: Diagnosis not present

## 2022-11-03 ENCOUNTER — Encounter: Payer: Self-pay | Admitting: Cardiovascular Disease

## 2022-11-03 ENCOUNTER — Ambulatory Visit: Payer: BC Managed Care – PPO | Attending: Cardiovascular Disease | Admitting: Cardiovascular Disease

## 2022-11-03 VITALS — BP 108/62 | HR 84 | Ht 62.0 in | Wt 161.0 lb

## 2022-11-03 DIAGNOSIS — Z8249 Family history of ischemic heart disease and other diseases of the circulatory system: Secondary | ICD-10-CM | POA: Diagnosis not present

## 2022-11-03 DIAGNOSIS — E782 Mixed hyperlipidemia: Secondary | ICD-10-CM

## 2022-11-03 NOTE — Patient Instructions (Addendum)
Medication Instructions:  Your physician recommends that you continue on your current medications as directed. Please refer to the Current Medication list given to you today.  *If you need a refill on your cardiac medications before your next appointment, please call your pharmacy*  Lab Work: If you have labs (blood work) drawn today and your tests are completely normal, you will receive your results only by: MyChart Message (if you have MyChart) OR A paper copy in the mail If you have any lab test that is abnormal or we need to change your treatment, we will call you to review the results.  Testing/Procedures: Your physician has requested that you have an echocardiogram. Echocardiography is a painless test that uses sound waves to create images of your heart. It provides your doctor with information about the size and shape of your heart and how well your heart's chambers and valves are working. This procedure takes approximately one hour. There are no restrictions for this procedure. Please do NOT wear cologne, perfume, aftershave, or lotions (deodorant is allowed). Please arrive 15 minutes prior to your appointment time.  Cardiac CT scanning for calcium score, (CAT scanning), is a noninvasive, special x-ray that produces cross-sectional images of the body using x-rays and a computer. CT scans help physicians diagnose and treat medical conditions. For some CT exams, a contrast material is used to enhance visibility in the area of the body being studied. CT scans provide greater clarity and reveal more details than regular x-ray exams.    Follow-Up: At [facility], you and your health needs are our priority.  As part of our continuing mission to provide you with exceptional heart care, we have created designated Provider Care Teams.  These Care Teams include your primary Cardiologist (physician) and Advanced Practice Providers (APPs -  Physician Assistants and Nurse Practitioners) who  all work together to provide you with the care you need, when you need it.  We recommend signing up for the patient portal called "MyChart".  Sign up information is provided on this After Visit Summary.  MyChart is used to connect with patients for Virtual Visits (Telemedicine).  Patients are able to view lab/test results, encounter notes, upcoming appointments, etc.  Non-urgent messages can be sent to your provider as well.   To learn more about what you can do with MyChart, go to [email or web address].    Your next appointment:   As needed  The format for your next appointment:   In Person  Provider:   Charlton Haws, MD      Important Information About Sugar

## 2022-11-04 ENCOUNTER — Ambulatory Visit (HOSPITAL_COMMUNITY): Payer: BC Managed Care – PPO | Attending: Cardiovascular Disease

## 2022-11-04 DIAGNOSIS — E782 Mixed hyperlipidemia: Secondary | ICD-10-CM | POA: Diagnosis not present

## 2022-11-04 DIAGNOSIS — Z8249 Family history of ischemic heart disease and other diseases of the circulatory system: Secondary | ICD-10-CM

## 2022-11-04 DIAGNOSIS — I517 Cardiomegaly: Secondary | ICD-10-CM | POA: Diagnosis not present

## 2022-11-04 DIAGNOSIS — I421 Obstructive hypertrophic cardiomyopathy: Secondary | ICD-10-CM | POA: Diagnosis not present

## 2022-11-04 LAB — ECHOCARDIOGRAM COMPLETE
Area-P 1/2: 2.82 cm2
S' Lateral: 2.8 cm

## 2022-11-06 DIAGNOSIS — M9903 Segmental and somatic dysfunction of lumbar region: Secondary | ICD-10-CM | POA: Diagnosis not present

## 2022-11-06 DIAGNOSIS — M9901 Segmental and somatic dysfunction of cervical region: Secondary | ICD-10-CM | POA: Diagnosis not present

## 2022-11-06 DIAGNOSIS — M531 Cervicobrachial syndrome: Secondary | ICD-10-CM | POA: Diagnosis not present

## 2022-11-06 DIAGNOSIS — M9905 Segmental and somatic dysfunction of pelvic region: Secondary | ICD-10-CM | POA: Diagnosis not present

## 2022-11-27 ENCOUNTER — Emergency Department (HOSPITAL_COMMUNITY): Payer: BC Managed Care – PPO

## 2022-11-27 ENCOUNTER — Encounter (HOSPITAL_COMMUNITY): Payer: Self-pay

## 2022-11-27 ENCOUNTER — Other Ambulatory Visit: Payer: Self-pay

## 2022-11-27 ENCOUNTER — Emergency Department (HOSPITAL_COMMUNITY)
Admission: EM | Admit: 2022-11-27 | Discharge: 2022-11-27 | Disposition: A | Discharge status: Home / Self Care | Payer: BC Managed Care – PPO | Attending: Emergency Medicine | Admitting: Emergency Medicine

## 2022-11-27 ENCOUNTER — Encounter: Payer: Self-pay | Admitting: Cardiovascular Disease

## 2022-11-27 DIAGNOSIS — R0789 Other chest pain: Secondary | ICD-10-CM | POA: Diagnosis not present

## 2022-11-27 DIAGNOSIS — R072 Precordial pain: Secondary | ICD-10-CM | POA: Diagnosis not present

## 2022-11-27 DIAGNOSIS — R079 Chest pain, unspecified: Secondary | ICD-10-CM | POA: Diagnosis not present

## 2022-11-27 LAB — CBC WITH DIFFERENTIAL/PLATELET
Abs Immature Granulocytes: 0.02 10*3/uL (ref 0.00–0.07)
Basophils Absolute: 0 10*3/uL (ref 0.0–0.1)
Basophils Relative: 0 %
Eosinophils Absolute: 0.3 10*3/uL (ref 0.0–0.5)
Eosinophils Relative: 3 %
HCT: 42.8 % (ref 36.0–46.0)
Hemoglobin: 14 g/dL (ref 12.0–15.0)
Immature Granulocytes: 0 %
Lymphocytes Relative: 27 %
Lymphs Abs: 2.6 10*3/uL (ref 0.7–4.0)
MCH: 27.5 pg (ref 26.0–34.0)
MCHC: 32.7 g/dL (ref 30.0–36.0)
MCV: 84.1 fL (ref 80.0–100.0)
Monocytes Absolute: 0.8 10*3/uL (ref 0.1–1.0)
Monocytes Relative: 8 %
Neutro Abs: 5.9 10*3/uL (ref 1.7–7.7)
Neutrophils Relative %: 62 %
Platelets: 258 10*3/uL (ref 150–400)
RBC: 5.09 MIL/uL (ref 3.87–5.11)
RDW: 12.9 % (ref 11.5–15.5)
WBC: 9.6 10*3/uL (ref 4.0–10.5)
nRBC: 0 % (ref 0.0–0.2)

## 2022-11-27 LAB — BASIC METABOLIC PANEL
Anion gap: 9 (ref 5–15)
BUN: 9 mg/dL (ref 6–20)
CO2: 23 mmol/L (ref 22–32)
Calcium: 9.1 mg/dL (ref 8.9–10.3)
Chloride: 105 mmol/L (ref 98–111)
Creatinine, Ser: 0.77 mg/dL (ref 0.44–1.00)
GFR, Estimated: >60 mL/min (ref >60)
Glucose, Bld: 106 mg/dL — ABNORMAL HIGH (ref 70–99)
Potassium: 3.6 mmol/L (ref 3.5–5.1)
Sodium: 137 mmol/L (ref 135–145)

## 2022-11-27 LAB — I-STAT BETA HCG BLOOD, ED (MC, WL, AP ONLY): I-stat hCG, quantitative: <5 m[IU]/mL (ref <5)

## 2022-11-27 LAB — TROPONIN I (HIGH SENSITIVITY)
Troponin I (High Sensitivity): <2 ng/L (ref <18)
Troponin I (High Sensitivity): <2 ng/L (ref <18)

## 2022-11-27 MED ORDER — NITROGLYCERIN 2 % TD OINT: 1.0000 [in_us] | TOPICAL_OINTMENT | Freq: Once | TRANSDERMAL | Status: DC

## 2022-11-27 MED ORDER — FUROSEMIDE 10 MG/ML IJ SOLN: 80.0000 mg | Freq: Once | INTRAMUSCULAR | Status: DC

## 2022-11-27 NOTE — Discharge Instructions (Signed)
Please read and follow all provided instructions.  Your diagnoses today include:  1. Precordial pain     Tests performed today include: An EKG of your heart A chest x-ray Cardiac enzymes - a blood test for heart muscle damage Blood counts and electrolytes Vital signs. See below for your results today.   Medications prescribed:  None  Take any prescribed medications only as directed.  Follow-up instructions: Please follow-up with your primary care provider and cardiologist as planned for further evaluation of your symptoms.   Return instructions:  SEEK IMMEDIATE MEDICAL ATTENTION IF: You have severe chest pain, especially if the pain is crushing or pressure-like and spreads to the arms, back, neck, or jaw, or if you have sweating, nausea or vomiting, or trouble with breathing. THIS IS AN EMERGENCY. Do not wait to see if the pain will go away. Get medical help at once. Call 911. DO NOT drive yourself to the hospital.  Your chest pain gets worse and does not go away after a few minutes of rest.  You have an attack of chest pain lasting longer than what you usually experience.  You have significant dizziness, if you pass out, or have trouble walking.  You have chest pain not typical of your usual pain for which you originally saw your caregiver.  You have any other emergent concerns regarding your health.  Additional Information: Chest pain comes from many different causes. Your caregiver has diagnosed you as having chest pain that is not specific for one problem, but does not require admission.  You are at low risk for an acute heart condition or other serious illness.   Your vital signs today were: BP 112/70 (BP Location: Right Arm)   Pulse 64   Temp 99 F (37.2 C)   Resp 16   SpO2 99%  If your blood pressure (BP) was elevated above 135/85 this visit, please have this repeated by your doctor within one month. --------------

## 2022-11-27 NOTE — ED Provider Triage Note (Signed)
Emergency Medicine Provider Triage Evaluation Note  Brandi Snyder , a 42 y.o. female  was evaluated in triage.  Pt complains of chest tightness ongoing since Sunday.  No cough, fever, or other URI symptoms.  Saw cardiology last month for echo as mom has hx of hypertrophic cardiomyopathy-- told echo was normal.  Not a smoker.  Review of Systems  Positive: Chest pain Negative: Fever, cough  Physical Exam  BP (!) 123/45   Pulse 81   Temp 98.7 F (37.1 C) (Oral)   Resp 20   SpO2 99%  Gen:   Awake, no distress   Resp:  Normal effort  MSK:   Moves extremities without difficulty  Other:    Medical Decision Making  Medically screening exam initiated at 1:50 AM.  Appropriate orders placed.  Marianela Mandrell was informed that the remainder of the evaluation will be completed by another provider, this initial triage assessment does not replace that evaluation, and the importance of remaining in the ED until their evaluation is complete.  Chest pain.  Recent cardiology eval with normal echo last month.  EKG, labs, CXR.   Larene Pickett, PA-C 11/27/22 0157

## 2022-11-27 NOTE — ED Triage Notes (Signed)
Intermittent chest tightness beginning Sunday. Began as pressure under left breast but has moved to substernal / right chest.   Pain started while asleep.   Recently seen cardiologist for ECHO to r/o hypertrophic cardiomyopathy given family history.

## 2022-11-27 NOTE — ED Provider Notes (Signed)
[facility] EMERGENCY DEPARTMENT Provider Note   CSN: [id number] Arrival date & time: 11/27/22  0110     History  Chief Complaint  Patient presents with   Chest Pain    Brandi Snyder is a 42 y.o. female.  Patient presents to the emergency department for evaluation of chest tightness.  Patient has had some intermittent episodes of chest tightness over the past week or so.  She reports 1 instance last Friday which occurred while she was trying to sleep.  This was a sharp.  Pain that did not radiate.  No exertional component.  No diaphoresis or vomiting but has had some nausea.  Subsequently she had some lightheadedness but no full syncope.  Last night she was again trying to sleep and developed more of a tightness in the left chest.  Again nonexertional.  Again some nausea but no vomiting.  Symptoms improved now.  She has followed up with cardiology and had an echocardiogram last month which was overall reassuring.  She has a family history of hypertrophic cardiomyopathy.  She is pending a calcium score later this month.  She has a history of high cholesterol controlled with diet.  No hypertension, high cholesterol, smoking. Patient denies risk factors for pulmonary embolism including: unilateral leg swelling, history of DVT/PE/other blood clots, use of exogenous hormones, recent immobilizations, recent surgery, recent travel (>4hr segment), malignancy, hemoptysis.          Home Medications Prior to Admission medications   Medication Sig Start Date End Date Taking? Authorizing Provider  budesonide-formoterol (SYMBICORT) 80-4.5 MCG/ACT inhaler Inhale 2 puffs into the lungs daily.    [provider]  cetirizine (ZYRTEC) 10 MG tablet Take 10 mg by mouth daily.    [provider]  fluticasone (FLONASE) 50 MCG/ACT nasal spray Place 1 spray into both nostrils daily.    [provider]  ibuprofen (ADVIL,MOTRIN) 600 MG tablet Take 1 tablet (600 mg  total) by mouth every 6 (six) hours as needed. 08/10/15   Harold Hedge, MD      Allergies    Patient has no known allergies.    Review of Systems   Review of Systems  Physical Exam Updated Vital Signs BP 112/70 (BP Location: Right Arm)   Pulse 64   Temp 99 F (37.2 C)   Resp 16   SpO2 99%  Physical Exam Vitals and nursing note reviewed.  Constitutional:      Appearance: She is well-developed. She is not diaphoretic.  HENT:     Head: Normocephalic and atraumatic.     Mouth/Throat:     Mouth: Mucous membranes are not dry.  Eyes:     Conjunctiva/sclera: Conjunctivae normal.  Neck:     Vascular: Normal carotid pulses. No JVD.     Trachea: Trachea normal. No tracheal deviation.  Cardiovascular:     Rate and Rhythm: Normal rate and regular rhythm.     Pulses: No decreased pulses.          Radial pulses are 2+ on the right side and 2+ on the left side.     Heart sounds: Normal heart sounds, S1 normal and S2 normal. No murmur heard. Pulmonary:     Effort: Pulmonary effort is normal. No respiratory distress.     Breath sounds: No wheezing.  Chest:     Chest wall: No tenderness.  Abdominal:     General: Bowel sounds are normal.     Palpations: Abdomen is soft.  Tenderness: There is no abdominal tenderness. There is no guarding or rebound.  Musculoskeletal:        General: Normal range of motion.     Cervical back: Normal range of motion and neck supple. No muscular tenderness.     Right lower leg: No edema.     Left lower leg: No edema.  Skin:    General: Skin is warm and dry.     Coloration: Skin is not pale.  Neurological:     Mental Status: She is alert.     ED Results / Procedures / Treatments   Labs (all labs ordered are listed, but only abnormal results are displayed) Labs Reviewed  BASIC METABOLIC PANEL - Abnormal; Notable for the following components:      Result Value   Glucose, Bld 106 (*)    All other components within normal limits  CBC WITH  DIFFERENTIAL/PLATELET  I-STAT BETA HCG BLOOD, ED ([facility], [facility], [facility] ONLY)  TROPONIN I (HIGH SENSITIVITY)  TROPONIN I (HIGH SENSITIVITY)    EKG None  Radiology DG Chest 2 View  Result Date: 11/27/2022 CLINICAL DATA:  Mid chest pain for 4 days. EXAM: CHEST - 2 VIEW COMPARISON:  None Available. FINDINGS: The heart size and mediastinal contours are within normal limits. Both lungs are clear. The visualized skeletal structures are unremarkable. IMPRESSION: No active cardiopulmonary disease. Electronically Signed   By: Telford Nab M.D.   On: 11/27/2022 02:13    Procedures Procedures    Medications Ordered in ED Medications - No data to display  ED Course/ Medical Decision Making/ A&P    Patient seen and examined. History obtained directly from patient. Work-up including labs, imaging, EKG ordered in triage, if performed, were reviewed.    Labs/EKG: Independently reviewed and interpreted.  This included: CBC unremarkable; BMP glucose 106 otherwise unremarkable; troponin's normal <2 >> <2; negative pregnancy.  EKG reviewed and interpreted as above.  No ischemic or concerning findings.  Imaging: Independently visualized and interpreted.  This included: Chest x-ray, agree negative.  Medications/Fluids: None ordered.  Most recent vital signs reviewed and are as follows: BP 112/70 (BP Location: Right Arm)   Pulse 64   Temp 99 F (37.2 C)   Resp 16   SpO2 99%   Initial impression: Atypical chest pain.  Reassuring workup.  PERC negative.  Plan: Discharge to home.   Prescriptions written for: None  Return and follow-up instructions: I encouraged patient to return to ED with severe chest pain, especially if the pain is crushing or pressure-like and spreads to the arms, back, neck, or jaw, or if they have associated sweating, vomiting, or shortness of breath with the pain, or significant pain with activity. We discussed that the evaluation here today indicates a low-risk of serious cause of  chest pain, including heart trouble or a blood clot, but no evaluation is perfect and chest pain can evolve with time. The patient verbalized understanding and agreed.  I encouraged patient to follow-up with their provider in the next 48 hours for recheck.                             Medical Decision Making  For this patient's complaint of chest pain, the following emergent conditions were considered on the differential diagnosis: acute coronary syndrome, pulmonary embolism, pneumothorax, myocarditis, pericardial tamponade, aortic dissection, thoracic aortic aneurysm complication, esophageal perforation.   Other causes were also considered including: gastroesophageal reflux disease, musculoskeletal pain  including costochondritis, pneumonia/pleurisy, herpes zoster, pericarditis.  In regards to possibility of ACS, patient has atypical features of pain, non-ischemic and unchanged EKG and negative troponin(s). Heart score was calculated to be 1.   In regards to possibility of PE, symptoms are atypical for PE and patient is PERC negative and chance of PE < 2%.          Final Clinical Impression(s) / ED Diagnoses Final diagnoses:  Precordial pain    Rx / DC Orders ED Discharge Orders     None         Carlisle Cater, PA-C 11/27/22 1059    Charlesetta Shanks, MD 11/27/22 [phone]

## 2022-12-05 DIAGNOSIS — M9903 Segmental and somatic dysfunction of lumbar region: Secondary | ICD-10-CM | POA: Diagnosis not present

## 2022-12-05 DIAGNOSIS — M531 Cervicobrachial syndrome: Secondary | ICD-10-CM | POA: Diagnosis not present

## 2022-12-05 DIAGNOSIS — M9905 Segmental and somatic dysfunction of pelvic region: Secondary | ICD-10-CM | POA: Diagnosis not present

## 2022-12-05 DIAGNOSIS — M9901 Segmental and somatic dysfunction of cervical region: Secondary | ICD-10-CM | POA: Diagnosis not present

## 2022-12-16 ENCOUNTER — Ambulatory Visit (HOSPITAL_BASED_OUTPATIENT_CLINIC_OR_DEPARTMENT_OTHER)
Admission: RE | Admit: 2022-12-16 | Discharge: 2022-12-16 | Disposition: A | Discharge status: Home / Self Care | Payer: BC Managed Care – PPO | Source: Ambulatory Visit | Attending: Cardiovascular Disease | Admitting: Cardiovascular Disease

## 2022-12-16 DIAGNOSIS — E782 Mixed hyperlipidemia: Secondary | ICD-10-CM | POA: Insufficient documentation

## 2022-12-16 DIAGNOSIS — Z8249 Family history of ischemic heart disease and other diseases of the circulatory system: Secondary | ICD-10-CM | POA: Insufficient documentation

## 2023-01-02 DIAGNOSIS — M9903 Segmental and somatic dysfunction of lumbar region: Secondary | ICD-10-CM | POA: Diagnosis not present

## 2023-01-02 DIAGNOSIS — M9901 Segmental and somatic dysfunction of cervical region: Secondary | ICD-10-CM | POA: Diagnosis not present

## 2023-01-02 DIAGNOSIS — M65849 Other synovitis and tenosynovitis, unspecified hand: Secondary | ICD-10-CM | POA: Diagnosis not present

## 2023-01-02 DIAGNOSIS — M531 Cervicobrachial syndrome: Secondary | ICD-10-CM | POA: Diagnosis not present

## 2023-01-02 DIAGNOSIS — M9902 Segmental and somatic dysfunction of thoracic region: Secondary | ICD-10-CM | POA: Diagnosis not present

## 2023-01-05 DIAGNOSIS — M9903 Segmental and somatic dysfunction of lumbar region: Secondary | ICD-10-CM | POA: Diagnosis not present

## 2023-01-05 DIAGNOSIS — M65849 Other synovitis and tenosynovitis, unspecified hand: Secondary | ICD-10-CM | POA: Diagnosis not present

## 2023-01-05 DIAGNOSIS — M9901 Segmental and somatic dysfunction of cervical region: Secondary | ICD-10-CM | POA: Diagnosis not present

## 2023-01-05 DIAGNOSIS — M531 Cervicobrachial syndrome: Secondary | ICD-10-CM | POA: Diagnosis not present

## 2023-01-05 DIAGNOSIS — M9902 Segmental and somatic dysfunction of thoracic region: Secondary | ICD-10-CM | POA: Diagnosis not present

## 2023-01-09 DIAGNOSIS — M9902 Segmental and somatic dysfunction of thoracic region: Secondary | ICD-10-CM | POA: Diagnosis not present

## 2023-01-09 DIAGNOSIS — M9903 Segmental and somatic dysfunction of lumbar region: Secondary | ICD-10-CM | POA: Diagnosis not present

## 2023-01-09 DIAGNOSIS — M531 Cervicobrachial syndrome: Secondary | ICD-10-CM | POA: Diagnosis not present

## 2023-01-09 DIAGNOSIS — M9901 Segmental and somatic dysfunction of cervical region: Secondary | ICD-10-CM | POA: Diagnosis not present

## 2023-01-14 DIAGNOSIS — M9903 Segmental and somatic dysfunction of lumbar region: Secondary | ICD-10-CM | POA: Diagnosis not present

## 2023-01-14 DIAGNOSIS — M65849 Other synovitis and tenosynovitis, unspecified hand: Secondary | ICD-10-CM | POA: Diagnosis not present

## 2023-01-14 DIAGNOSIS — M9901 Segmental and somatic dysfunction of cervical region: Secondary | ICD-10-CM | POA: Diagnosis not present

## 2023-01-14 DIAGNOSIS — M9902 Segmental and somatic dysfunction of thoracic region: Secondary | ICD-10-CM | POA: Diagnosis not present

## 2023-01-14 DIAGNOSIS — M531 Cervicobrachial syndrome: Secondary | ICD-10-CM | POA: Diagnosis not present

## 2023-01-28 DIAGNOSIS — M65849 Other synovitis and tenosynovitis, unspecified hand: Secondary | ICD-10-CM | POA: Diagnosis not present

## 2023-01-28 DIAGNOSIS — M531 Cervicobrachial syndrome: Secondary | ICD-10-CM | POA: Diagnosis not present

## 2023-01-28 DIAGNOSIS — M9901 Segmental and somatic dysfunction of cervical region: Secondary | ICD-10-CM | POA: Diagnosis not present

## 2023-01-28 DIAGNOSIS — M9903 Segmental and somatic dysfunction of lumbar region: Secondary | ICD-10-CM | POA: Diagnosis not present

## 2023-01-28 DIAGNOSIS — M9902 Segmental and somatic dysfunction of thoracic region: Secondary | ICD-10-CM | POA: Diagnosis not present

## 2023-02-19 DIAGNOSIS — M9902 Segmental and somatic dysfunction of thoracic region: Secondary | ICD-10-CM | POA: Diagnosis not present

## 2023-02-19 DIAGNOSIS — M531 Cervicobrachial syndrome: Secondary | ICD-10-CM | POA: Diagnosis not present

## 2023-02-19 DIAGNOSIS — M9901 Segmental and somatic dysfunction of cervical region: Secondary | ICD-10-CM | POA: Diagnosis not present

## 2023-02-19 DIAGNOSIS — M9903 Segmental and somatic dysfunction of lumbar region: Secondary | ICD-10-CM | POA: Diagnosis not present

## 2023-03-13 DIAGNOSIS — M531 Cervicobrachial syndrome: Secondary | ICD-10-CM | POA: Diagnosis not present

## 2023-03-13 DIAGNOSIS — M9901 Segmental and somatic dysfunction of cervical region: Secondary | ICD-10-CM | POA: Diagnosis not present

## 2023-03-13 DIAGNOSIS — M9902 Segmental and somatic dysfunction of thoracic region: Secondary | ICD-10-CM | POA: Diagnosis not present

## 2023-03-13 DIAGNOSIS — M65849 Other synovitis and tenosynovitis, unspecified hand: Secondary | ICD-10-CM | POA: Diagnosis not present

## 2023-03-13 DIAGNOSIS — M9903 Segmental and somatic dysfunction of lumbar region: Secondary | ICD-10-CM | POA: Diagnosis not present

## 2023-03-27 DIAGNOSIS — Z23 Encounter for immunization: Secondary | ICD-10-CM | POA: Diagnosis not present

## 2023-03-27 IMAGING — MG MM DIGITAL DIAGNOSTIC UNILAT*L* W/ TOMO W/ CAD
8 series · 9 of 24 positions shown · non-contrast
Comparison: Previous exams including recent screening mammogram
dated 03/10/2022.

CLINICAL DATA: Patient returns today to evaluate a possible LEFT
breast asymmetry questioned on recent screening mammogram.

EXAM:
DIGITAL DIAGNOSTIC UNILATERAL LEFT MAMMOGRAM WITH TOMOSYNTHESIS AND
CAD
TECHNIQUE: Left digital diagnostic mammography and breast tomosynthesis was
performed. The images were evaluated with computer-aided detection.

[L CC synth-2D (1 of 3)]
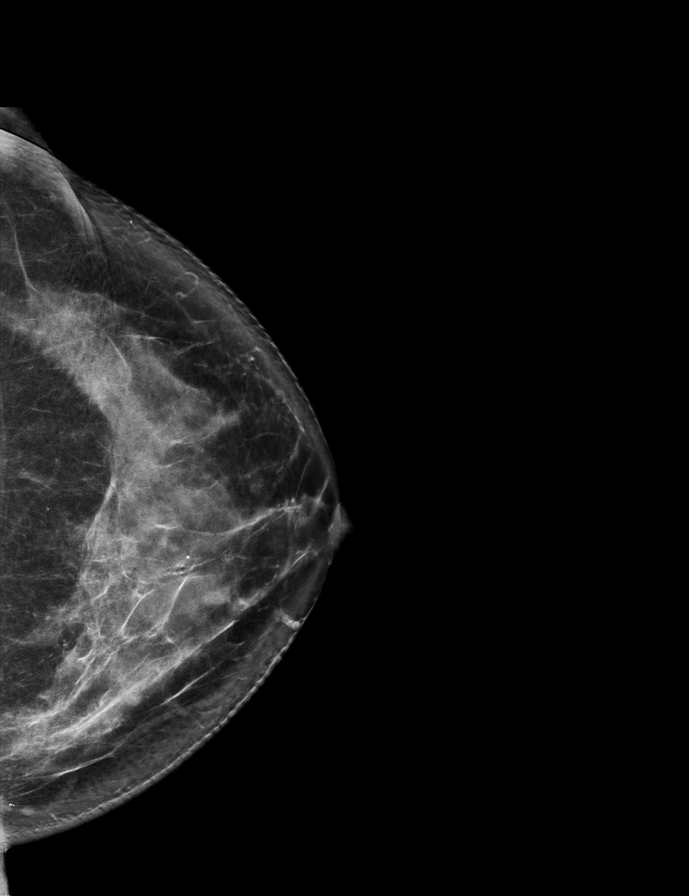

[L ML synth-2D]
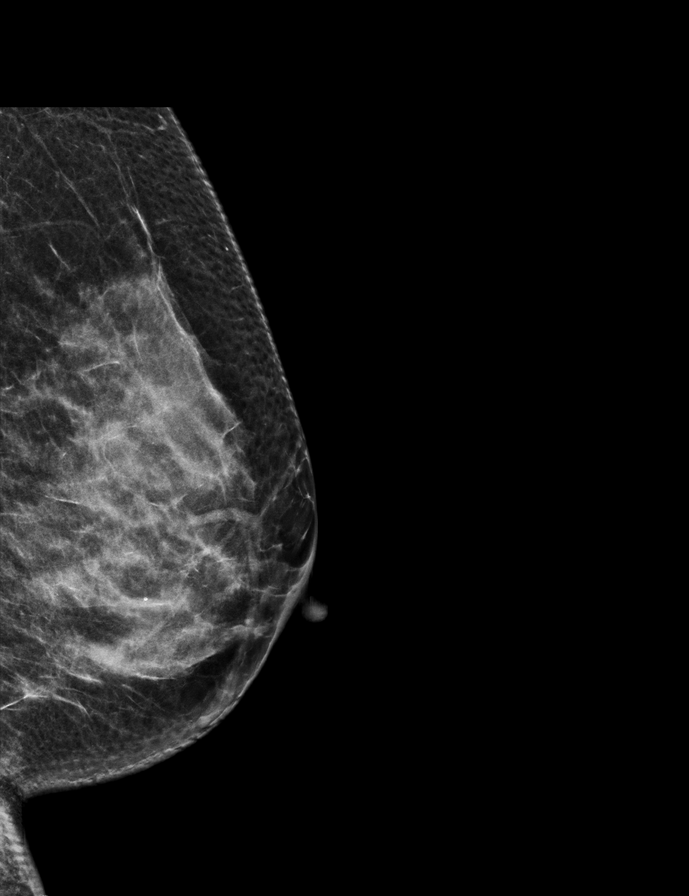

[L CC synth-2D (2 of 3)]
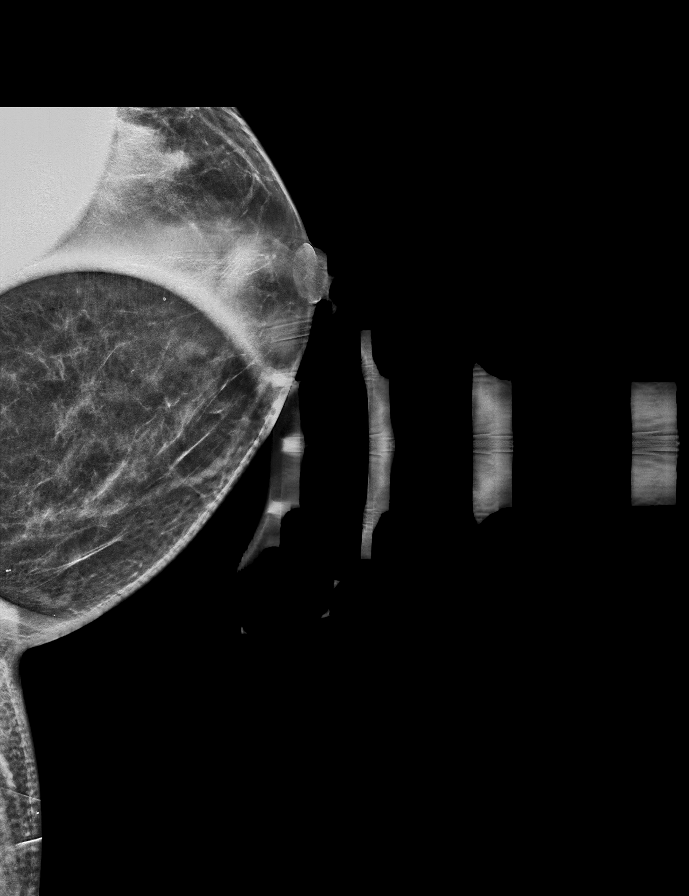

[L CC synth-2D (3 of 3)]
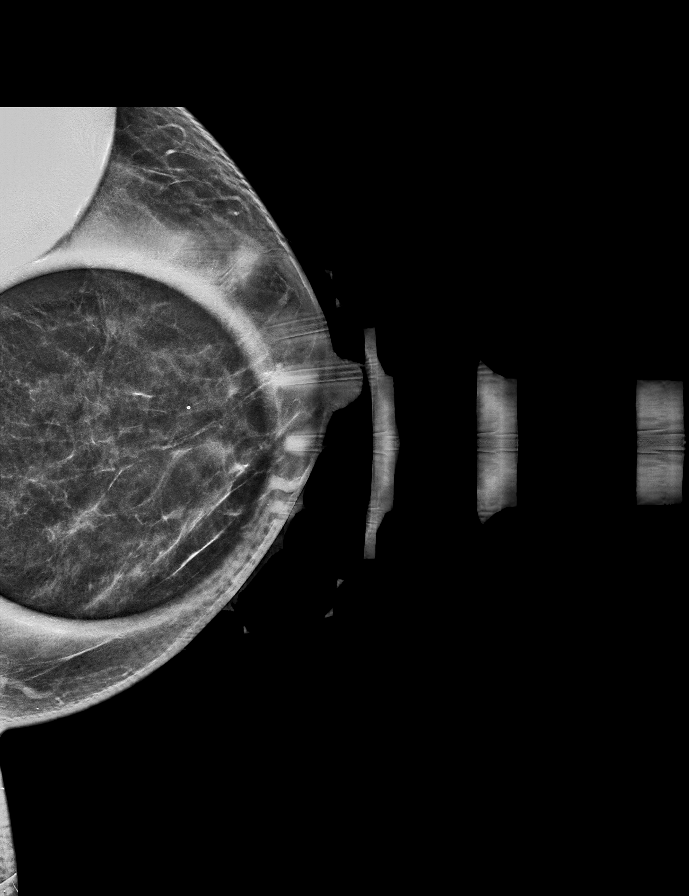

[L CC tomo · 2 of 71 frames shown (1 of 3)]
[frame 23/71]
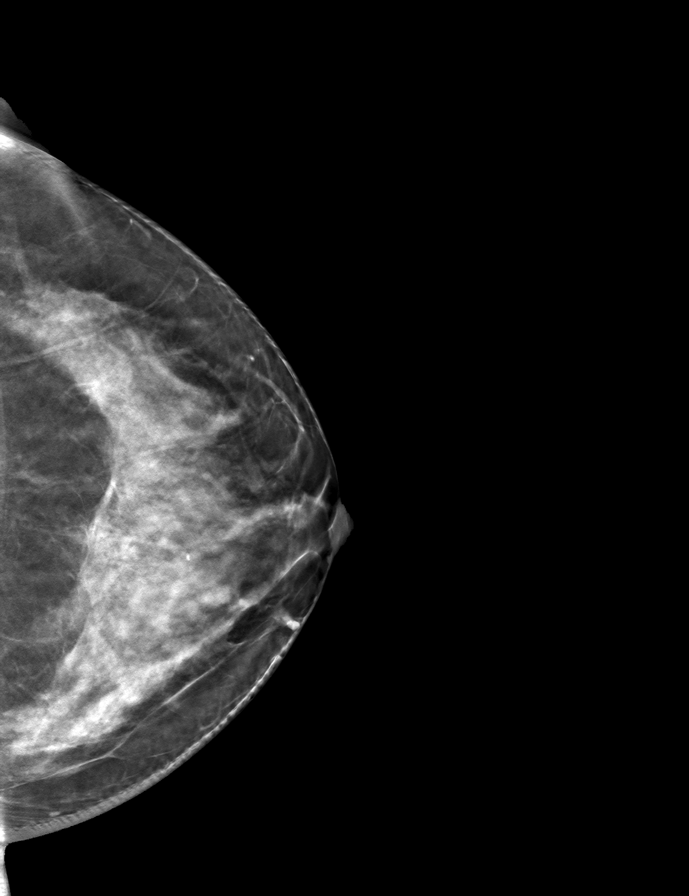
[frame 36/71]
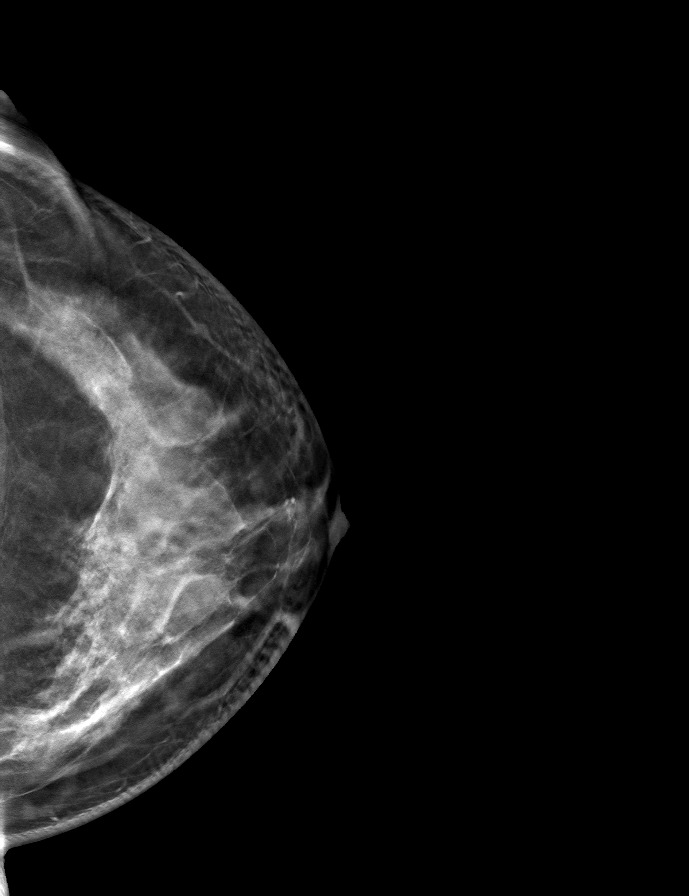

[L CC tomo (2 of 3) · tomo slice 29/56.0]
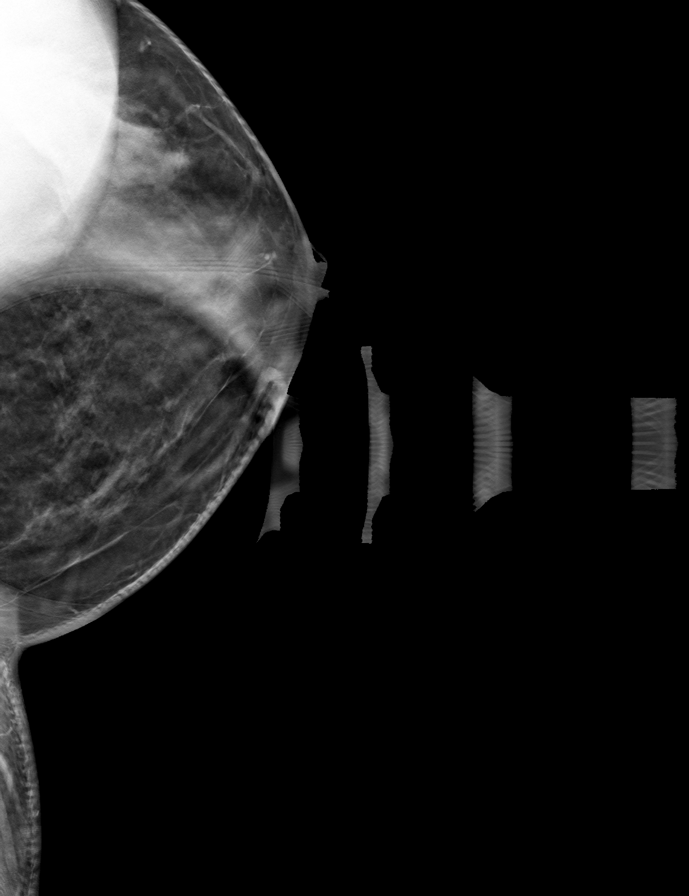

[L ML tomo · tomo slice 29/57.0]
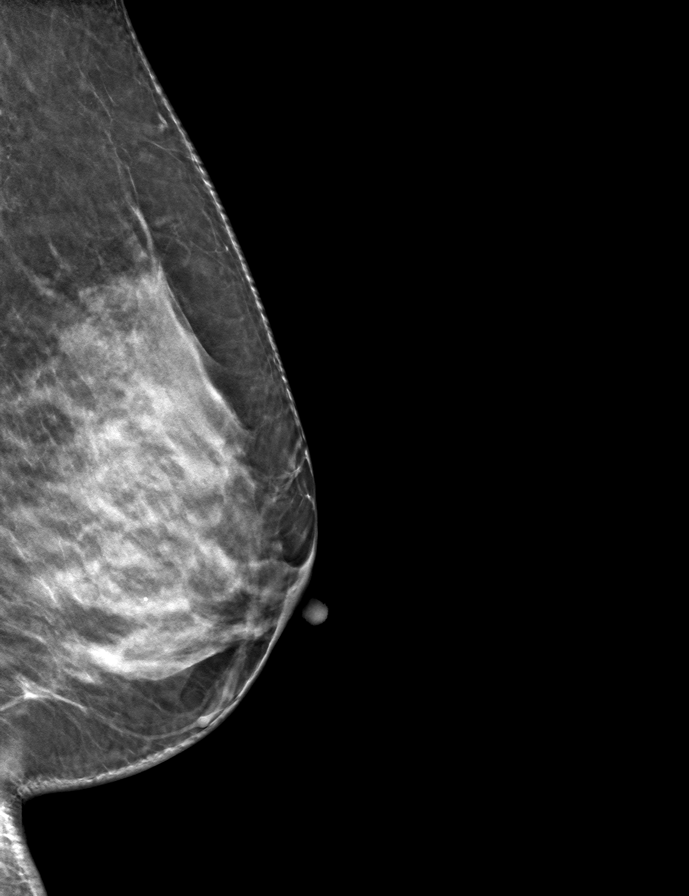

[L CC tomo (3 of 3) · tomo slice 31/60.0]
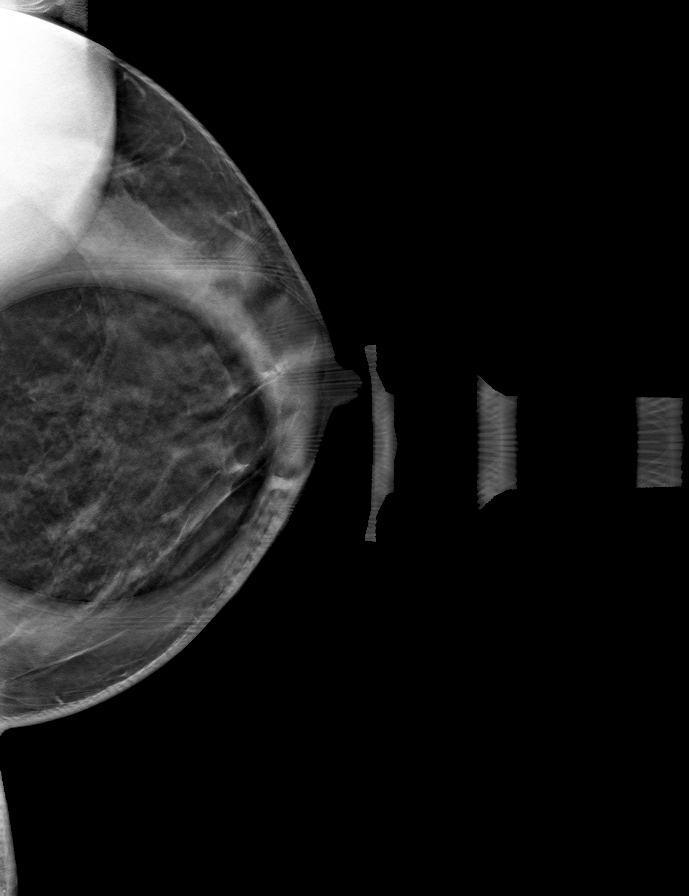

[9 of 24 positions shown; findings below may reference images not displayed]

ACR Breast Density Category c: The breast tissue is heterogeneously
dense, which may obscure small masses.
FINDINGS: On today's additional diagnostic views, including spot compression
with 3D tomosynthesis, there is no persistent asymmetry within the
inner LEFT breast at posterior depth indicating superimposition of
normal dense fibroglandular tissues.
IMPRESSION: No evidence of malignancy.

Patient may return to routine annual bilateral screening mammogram
schedule.

RECOMMENDATION:
Screening mammogram in one year.(Code:[id number]-G-1C1)

I have discussed the findings and recommendations with the patient.
If applicable, a reminder letter will be sent to the patient
regarding the next appointment.

BI-RADS CATEGORY  1: Negative.

## 2023-04-03 DIAGNOSIS — M65849 Other synovitis and tenosynovitis, unspecified hand: Secondary | ICD-10-CM | POA: Diagnosis not present

## 2023-04-03 DIAGNOSIS — M9901 Segmental and somatic dysfunction of cervical region: Secondary | ICD-10-CM | POA: Diagnosis not present

## 2023-04-03 DIAGNOSIS — M531 Cervicobrachial syndrome: Secondary | ICD-10-CM | POA: Diagnosis not present

## 2023-04-03 DIAGNOSIS — M9903 Segmental and somatic dysfunction of lumbar region: Secondary | ICD-10-CM | POA: Diagnosis not present

## 2023-04-03 DIAGNOSIS — M9902 Segmental and somatic dysfunction of thoracic region: Secondary | ICD-10-CM | POA: Diagnosis not present

## 2023-04-23 DIAGNOSIS — M9907 Segmental and somatic dysfunction of upper extremity: Secondary | ICD-10-CM | POA: Diagnosis not present

## 2023-04-23 DIAGNOSIS — M9902 Segmental and somatic dysfunction of thoracic region: Secondary | ICD-10-CM | POA: Diagnosis not present

## 2023-04-23 DIAGNOSIS — M9901 Segmental and somatic dysfunction of cervical region: Secondary | ICD-10-CM | POA: Diagnosis not present

## 2023-04-23 DIAGNOSIS — M9903 Segmental and somatic dysfunction of lumbar region: Secondary | ICD-10-CM | POA: Diagnosis not present

## 2023-04-23 DIAGNOSIS — M9905 Segmental and somatic dysfunction of pelvic region: Secondary | ICD-10-CM | POA: Diagnosis not present

## 2023-04-28 DIAGNOSIS — Z6828 Body mass index (BMI) 28.0-28.9, adult: Secondary | ICD-10-CM | POA: Diagnosis not present

## 2023-04-28 DIAGNOSIS — Z1231 Encounter for screening mammogram for malignant neoplasm of breast: Secondary | ICD-10-CM | POA: Diagnosis not present

## 2023-04-28 DIAGNOSIS — Z124 Encounter for screening for malignant neoplasm of cervix: Secondary | ICD-10-CM | POA: Diagnosis not present

## 2023-04-28 DIAGNOSIS — Z01419 Encounter for gynecological examination (general) (routine) without abnormal findings: Secondary | ICD-10-CM | POA: Diagnosis not present

## 2023-07-24 DIAGNOSIS — M9905 Segmental and somatic dysfunction of pelvic region: Secondary | ICD-10-CM | POA: Diagnosis not present

## 2023-07-24 DIAGNOSIS — M9903 Segmental and somatic dysfunction of lumbar region: Secondary | ICD-10-CM | POA: Diagnosis not present

## 2023-07-24 DIAGNOSIS — M9907 Segmental and somatic dysfunction of upper extremity: Secondary | ICD-10-CM | POA: Diagnosis not present

## 2023-07-24 DIAGNOSIS — M9902 Segmental and somatic dysfunction of thoracic region: Secondary | ICD-10-CM | POA: Diagnosis not present

## 2023-07-24 DIAGNOSIS — M9901 Segmental and somatic dysfunction of cervical region: Secondary | ICD-10-CM | POA: Diagnosis not present

## 2023-07-29 DIAGNOSIS — M9901 Segmental and somatic dysfunction of cervical region: Secondary | ICD-10-CM | POA: Diagnosis not present

## 2023-07-29 DIAGNOSIS — M9905 Segmental and somatic dysfunction of pelvic region: Secondary | ICD-10-CM | POA: Diagnosis not present

## 2023-07-29 DIAGNOSIS — M9903 Segmental and somatic dysfunction of lumbar region: Secondary | ICD-10-CM | POA: Diagnosis not present

## 2023-07-29 DIAGNOSIS — M9902 Segmental and somatic dysfunction of thoracic region: Secondary | ICD-10-CM | POA: Diagnosis not present

## 2023-07-29 DIAGNOSIS — M9907 Segmental and somatic dysfunction of upper extremity: Secondary | ICD-10-CM | POA: Diagnosis not present

## 2023-08-05 DIAGNOSIS — M9902 Segmental and somatic dysfunction of thoracic region: Secondary | ICD-10-CM | POA: Diagnosis not present

## 2023-08-05 DIAGNOSIS — M9903 Segmental and somatic dysfunction of lumbar region: Secondary | ICD-10-CM | POA: Diagnosis not present

## 2023-08-05 DIAGNOSIS — M9901 Segmental and somatic dysfunction of cervical region: Secondary | ICD-10-CM | POA: Diagnosis not present

## 2023-08-05 DIAGNOSIS — M9905 Segmental and somatic dysfunction of pelvic region: Secondary | ICD-10-CM | POA: Diagnosis not present

## 2023-08-19 DIAGNOSIS — M9902 Segmental and somatic dysfunction of thoracic region: Secondary | ICD-10-CM | POA: Diagnosis not present

## 2023-08-19 DIAGNOSIS — M9905 Segmental and somatic dysfunction of pelvic region: Secondary | ICD-10-CM | POA: Diagnosis not present

## 2023-08-19 DIAGNOSIS — M9901 Segmental and somatic dysfunction of cervical region: Secondary | ICD-10-CM | POA: Diagnosis not present

## 2023-08-19 DIAGNOSIS — M9903 Segmental and somatic dysfunction of lumbar region: Secondary | ICD-10-CM | POA: Diagnosis not present

## 2023-09-04 DIAGNOSIS — M9905 Segmental and somatic dysfunction of pelvic region: Secondary | ICD-10-CM | POA: Diagnosis not present

## 2023-09-04 DIAGNOSIS — M9901 Segmental and somatic dysfunction of cervical region: Secondary | ICD-10-CM | POA: Diagnosis not present

## 2023-09-04 DIAGNOSIS — M9903 Segmental and somatic dysfunction of lumbar region: Secondary | ICD-10-CM | POA: Diagnosis not present

## 2023-09-04 DIAGNOSIS — M9902 Segmental and somatic dysfunction of thoracic region: Secondary | ICD-10-CM | POA: Diagnosis not present

## 2023-09-14 DIAGNOSIS — Z0000 Encounter for general adult medical examination without abnormal findings: Secondary | ICD-10-CM | POA: Diagnosis not present

## 2023-09-14 DIAGNOSIS — Z1321 Encounter for screening for nutritional disorder: Secondary | ICD-10-CM | POA: Diagnosis not present

## 2023-09-14 DIAGNOSIS — J301 Allergic rhinitis due to pollen: Secondary | ICD-10-CM | POA: Diagnosis not present

## 2023-09-25 DIAGNOSIS — M9901 Segmental and somatic dysfunction of cervical region: Secondary | ICD-10-CM | POA: Diagnosis not present

## 2023-09-25 DIAGNOSIS — M9902 Segmental and somatic dysfunction of thoracic region: Secondary | ICD-10-CM | POA: Diagnosis not present

## 2023-09-25 DIAGNOSIS — M9903 Segmental and somatic dysfunction of lumbar region: Secondary | ICD-10-CM | POA: Diagnosis not present

## 2023-09-25 DIAGNOSIS — M9905 Segmental and somatic dysfunction of pelvic region: Secondary | ICD-10-CM | POA: Diagnosis not present

## 2023-10-02 DIAGNOSIS — M9905 Segmental and somatic dysfunction of pelvic region: Secondary | ICD-10-CM | POA: Diagnosis not present

## 2023-10-02 DIAGNOSIS — M9902 Segmental and somatic dysfunction of thoracic region: Secondary | ICD-10-CM | POA: Diagnosis not present

## 2023-10-02 DIAGNOSIS — M9903 Segmental and somatic dysfunction of lumbar region: Secondary | ICD-10-CM | POA: Diagnosis not present

## 2023-10-02 DIAGNOSIS — M9901 Segmental and somatic dysfunction of cervical region: Secondary | ICD-10-CM | POA: Diagnosis not present

## 2023-10-13 DIAGNOSIS — M9902 Segmental and somatic dysfunction of thoracic region: Secondary | ICD-10-CM | POA: Diagnosis not present

## 2023-10-13 DIAGNOSIS — M9901 Segmental and somatic dysfunction of cervical region: Secondary | ICD-10-CM | POA: Diagnosis not present

## 2023-10-13 DIAGNOSIS — M9903 Segmental and somatic dysfunction of lumbar region: Secondary | ICD-10-CM | POA: Diagnosis not present

## 2023-10-13 DIAGNOSIS — M9905 Segmental and somatic dysfunction of pelvic region: Secondary | ICD-10-CM | POA: Diagnosis not present

## 2023-10-29 DIAGNOSIS — M9902 Segmental and somatic dysfunction of thoracic region: Secondary | ICD-10-CM | POA: Diagnosis not present

## 2023-10-29 DIAGNOSIS — M9905 Segmental and somatic dysfunction of pelvic region: Secondary | ICD-10-CM | POA: Diagnosis not present

## 2023-10-29 DIAGNOSIS — M9903 Segmental and somatic dysfunction of lumbar region: Secondary | ICD-10-CM | POA: Diagnosis not present

## 2023-10-29 DIAGNOSIS — M9901 Segmental and somatic dysfunction of cervical region: Secondary | ICD-10-CM | POA: Diagnosis not present
